# Patient Record
Sex: Male | Born: 1980 | Race: Black or African American | Hispanic: No | Marital: Single | Smoking: Current every day smoker
Health system: Southern US, Community
[De-identification: ages and names within clinical notes are randomized; demographics above are authoritative.]

## PROBLEM LIST (undated history)

## (undated) DIAGNOSIS — S43006A Unspecified dislocation of unspecified shoulder joint, initial encounter: Secondary | ICD-10-CM

## (undated) DIAGNOSIS — J45909 Unspecified asthma, uncomplicated: Secondary | ICD-10-CM

## (undated) DIAGNOSIS — Z8709 Personal history of other diseases of the respiratory system: Secondary | ICD-10-CM

## (undated) HISTORY — PX: ANKLE ARTHROSCOPY: SUR85

## (undated) HISTORY — PX: INGUINAL HERNIA REPAIR: SHX194

## (undated) HISTORY — PX: ROTATOR CUFF REPAIR: SHX139

---

## 2007-01-07 HISTORY — PX: HERNIA REPAIR: SHX51

## 2010-02-26 ENCOUNTER — Emergency Department (INDEPENDENT_AMBULATORY_CARE_PROVIDER_SITE_OTHER): Payer: Self-pay

## 2010-02-26 ENCOUNTER — Emergency Department (HOSPITAL_BASED_OUTPATIENT_CLINIC_OR_DEPARTMENT_OTHER)
Admission: EM | Admit: 2010-02-26 | Discharge: 2010-02-26 | Disposition: A | Discharge status: Home / Self Care | Payer: Self-pay | Attending: Emergency Medicine | Admitting: Emergency Medicine

## 2010-02-26 DIAGNOSIS — F17200 Nicotine dependence, unspecified, uncomplicated: Secondary | ICD-10-CM | POA: Insufficient documentation

## 2010-02-26 DIAGNOSIS — M25519 Pain in unspecified shoulder: Secondary | ICD-10-CM

## 2010-02-26 DIAGNOSIS — W19XXXA Unspecified fall, initial encounter: Secondary | ICD-10-CM

## 2010-02-26 DIAGNOSIS — IMO0002 Reserved for concepts with insufficient information to code with codable children: Secondary | ICD-10-CM | POA: Insufficient documentation

## 2010-02-26 DIAGNOSIS — X58XXXA Exposure to other specified factors, initial encounter: Secondary | ICD-10-CM | POA: Insufficient documentation

## 2010-05-29 ENCOUNTER — Emergency Department (INDEPENDENT_AMBULATORY_CARE_PROVIDER_SITE_OTHER): Payer: Self-pay

## 2010-05-29 ENCOUNTER — Emergency Department (HOSPITAL_BASED_OUTPATIENT_CLINIC_OR_DEPARTMENT_OTHER)
Admission: EM | Admit: 2010-05-29 | Discharge: 2010-05-29 | Disposition: A | Discharge status: Home / Self Care | Payer: Self-pay | Attending: Emergency Medicine | Admitting: Emergency Medicine

## 2010-05-29 DIAGNOSIS — X500XXA Overexertion from strenuous movement or load, initial encounter: Secondary | ICD-10-CM | POA: Insufficient documentation

## 2010-05-29 DIAGNOSIS — IMO0002 Reserved for concepts with insufficient information to code with codable children: Secondary | ICD-10-CM | POA: Insufficient documentation

## 2010-05-29 DIAGNOSIS — M25519 Pain in unspecified shoulder: Secondary | ICD-10-CM

## 2010-06-29 ENCOUNTER — Emergency Department (INDEPENDENT_AMBULATORY_CARE_PROVIDER_SITE_OTHER): Payer: Self-pay

## 2010-06-29 ENCOUNTER — Emergency Department (HOSPITAL_BASED_OUTPATIENT_CLINIC_OR_DEPARTMENT_OTHER)
Admission: EM | Admit: 2010-06-29 | Discharge: 2010-06-29 | Disposition: A | Discharge status: Home / Self Care | Payer: Self-pay | Attending: Emergency Medicine | Admitting: Emergency Medicine

## 2010-06-29 DIAGNOSIS — X58XXXA Exposure to other specified factors, initial encounter: Secondary | ICD-10-CM

## 2010-06-29 DIAGNOSIS — S62319A Displaced fracture of base of unspecified metacarpal bone, initial encounter for closed fracture: Secondary | ICD-10-CM

## 2010-06-29 DIAGNOSIS — M25549 Pain in joints of unspecified hand: Secondary | ICD-10-CM

## 2010-07-30 DIAGNOSIS — F17200 Nicotine dependence, unspecified, uncomplicated: Secondary | ICD-10-CM | POA: Insufficient documentation

## 2010-07-30 DIAGNOSIS — S60229A Contusion of unspecified hand, initial encounter: Secondary | ICD-10-CM | POA: Insufficient documentation

## 2010-07-30 DIAGNOSIS — X58XXXA Exposure to other specified factors, initial encounter: Secondary | ICD-10-CM | POA: Insufficient documentation

## 2010-07-31 ENCOUNTER — Emergency Department (HOSPITAL_BASED_OUTPATIENT_CLINIC_OR_DEPARTMENT_OTHER)
Admission: EM | Admit: 2010-07-31 | Discharge: 2010-07-31 | Disposition: A | Discharge status: Home / Self Care | Payer: Self-pay | Attending: Emergency Medicine | Admitting: Emergency Medicine

## 2010-07-31 ENCOUNTER — Emergency Department (INDEPENDENT_AMBULATORY_CARE_PROVIDER_SITE_OTHER): Payer: Self-pay

## 2010-07-31 ENCOUNTER — Encounter: Payer: Self-pay | Admitting: *Deleted

## 2010-07-31 DIAGNOSIS — T148XXA Other injury of unspecified body region, initial encounter: Secondary | ICD-10-CM

## 2010-07-31 DIAGNOSIS — M79609 Pain in unspecified limb: Secondary | ICD-10-CM

## 2010-07-31 DIAGNOSIS — IMO0001 Reserved for inherently not codable concepts without codable children: Secondary | ICD-10-CM

## 2010-07-31 MED ORDER — IBUPROFEN 800 MG PO TABS: 800.0000 mg | ORAL_TABLET | Freq: Three times a day (TID) | ORAL | Status: AC

## 2010-07-31 MED ORDER — IBUPROFEN 800 MG PO TABS
800.0000 mg | ORAL_TABLET | Freq: Once | ORAL | Status: AC
  Administered 2010-07-31: 800 mg via ORAL
  Filled 2010-07-31: qty 1

## 2010-07-31 NOTE — ED Notes (Signed)
Injured R hand two nights. C/o swelling/pain. Hx of recent hand fracture, and had his cast removed 1 week ago.

## 2010-07-31 NOTE — ED Provider Notes (Signed)
History     Chief Complaint  Patient presents with  . Hand Pain   Patient is a 30 y.o. male presenting with hand pain. The history is provided by the patient.  Hand Pain This is a recurrent problem. The current episode started 1 to 2 hours ago. The problem occurs constantly. The problem has not changed since onset.Pertinent negatives include no chest pain, no abdominal pain, no headaches and no shortness of breath. Exacerbated by: movement. The symptoms are relieved by nothing. He has tried acetaminophen for the symptoms. The treatment provided mild relief.   Recent hand Fx and recently had cast removed, did not require surgery for closed MC fx.  He was lifting his child tonight and bumped his hand with severe pain, no fall or injury otherwsie History reviewed. No pertinent past medical history.  Past Surgical History  Procedure Date  . Hand surgery   . Hernia repair     History reviewed. No pertinent family history.  History  Substance Use Topics  . Smoking status: Current Everyday Smoker  . Smokeless tobacco: Not on file  . Alcohol Use: Yes     rarely      Review of Systems  Constitutional: Negative for fever and chills.  HENT: Negative for neck pain and neck stiffness.   Eyes: Negative for pain.  Respiratory: Negative for shortness of breath.   Cardiovascular: Negative for chest pain.  Gastrointestinal: Negative for abdominal pain.  Genitourinary: Negative for dysuria.  Musculoskeletal: Negative for myalgias, back pain and joint swelling.  Skin: Negative for rash.  Neurological: Negative for headaches.  All other systems reviewed and are negative.    Physical Exam  BP 126/69  Pulse 91  Temp(Src) 98 F (36.7 C) (Oral)  Resp 18  Ht 5\' 6"  (1.676 m)  Wt 130 lb (58.968 kg)  BMI 20.98 kg/m2  Physical Exam  Constitutional: He is oriented to person, place, and time. He appears well-developed and well-nourished.  HENT:  Head: Normocephalic and atraumatic.  Eyes:  Conjunctivae and EOM are normal. Pupils are equal, round, and reactive to light.  Neck: Trachea normal. Neck supple. No thyromegaly present.  Cardiovascular: Normal rate, regular rhythm, S1 normal, S2 normal and normal pulses.     No systolic murmur is present   No diastolic murmur is present  Pulses:      Radial pulses are 2+ on the right side, and 2+ on the left side.  Pulmonary/Chest: Effort normal and breath sounds normal. He has no wheezes. He has no rhonchi. He has no rales. He exhibits no tenderness.  Abdominal: Soft. Normal appearance and bowel sounds are normal. There is no tenderness. There is no CVA tenderness and negative Murphy's sign.  Musculoskeletal:       RUE: TTP over dorsum of hand in area of 4th MC, no tenderness over snuff box, distal N/V intact, able to make a closed fist. No erythema or swelling  Neurological: He is alert and oriented to person, place, and time. He has normal strength. No cranial nerve deficit or sensory deficit. GCS eye subscore is 4. GCS verbal subscore is 5. GCS motor subscore is 6.  Skin: Skin is warm and dry. No rash noted. He is not diaphoretic.  Psychiatric: His speech is normal.       Cooperative and appropriate    ED Course  Procedures  MDM Xray obtained and reviewed has healing FX no acute abnormality. Placed in splint and RX NSAIDs plan f/u Ortho as needed  Sunnie Nielsen, MD 07/31/10 [id number]

## 2010-10-14 ENCOUNTER — Emergency Department (INDEPENDENT_AMBULATORY_CARE_PROVIDER_SITE_OTHER): Payer: Self-pay

## 2010-10-14 ENCOUNTER — Emergency Department (HOSPITAL_BASED_OUTPATIENT_CLINIC_OR_DEPARTMENT_OTHER)
Admission: EM | Admit: 2010-10-14 | Discharge: 2010-10-14 | Disposition: A | Discharge status: Home / Self Care | Payer: Self-pay | Attending: Emergency Medicine | Admitting: Emergency Medicine

## 2010-10-14 ENCOUNTER — Encounter (HOSPITAL_BASED_OUTPATIENT_CLINIC_OR_DEPARTMENT_OTHER): Payer: Self-pay | Admitting: *Deleted

## 2010-10-14 DIAGNOSIS — W010XXA Fall on same level from slipping, tripping and stumbling without subsequent striking against object, initial encounter: Secondary | ICD-10-CM | POA: Insufficient documentation

## 2010-10-14 DIAGNOSIS — M25519 Pain in unspecified shoulder: Secondary | ICD-10-CM | POA: Insufficient documentation

## 2010-10-14 DIAGNOSIS — Y92009 Unspecified place in unspecified non-institutional (private) residence as the place of occurrence of the external cause: Secondary | ICD-10-CM | POA: Insufficient documentation

## 2010-10-14 DIAGNOSIS — S60221A Contusion of right hand, initial encounter: Secondary | ICD-10-CM

## 2010-10-14 DIAGNOSIS — M79609 Pain in unspecified limb: Secondary | ICD-10-CM

## 2010-10-14 DIAGNOSIS — W19XXXA Unspecified fall, initial encounter: Secondary | ICD-10-CM

## 2010-10-14 DIAGNOSIS — M25512 Pain in left shoulder: Secondary | ICD-10-CM

## 2010-10-14 DIAGNOSIS — S60229A Contusion of unspecified hand, initial encounter: Secondary | ICD-10-CM | POA: Insufficient documentation

## 2010-10-14 MED ORDER — HYDROCODONE-ACETAMINOPHEN 5-325 MG PO TABS: 1.0000 | ORAL_TABLET | ORAL | Status: AC | PRN

## 2010-10-14 MED ORDER — OXYCODONE-ACETAMINOPHEN 5-325 MG PO TABS
1.0000 | ORAL_TABLET | Freq: Once | ORAL | Status: AC
  Administered 2010-10-14: 1 via ORAL
  Filled 2010-10-14: qty 1

## 2010-10-14 NOTE — ED Provider Notes (Signed)
History    Scribed for Lyanne Co, MD, the patient was seen in room MH11/MH11. This chart was scribed by Katha Cabal. This patient's care was started at 8:12 PM.    CSN: [id number] Arrival date & time: 10/14/2010  6:42 PM  Chief Complaint  Patient presents with  . Shoulder Injury  . Hand Pain    (Consider location/radiation/quality/duration/timing/severity/associated sxs/prior treatment) HPI Left shoulder injury last night.  Fell down injuried right hand.  Patient has dislocated left shoulder last night.  Patient tripped over toys in the hallway.    Chase Olson is a 30 y.o. male who presents to the Emergency Department complaining of left shoulder injury with associated right hand pain that occurred last night.  Patient tripped over toys in the hallway and landed on his right hand.  Patient has taken Ibuprofen for pain with minimal relief.   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     PAST MEDICAL HISTORY:  History reviewed. No pertinent past medical history.  PAST SURGICAL HISTORY:  Past Surgical History  Procedure Date  . Hand surgery   . Hernia repair     FAMILY HISTORY:  History reviewed. No pertinent family history.   SOCIAL HISTORY: History   Social History  . Marital Status: Married    Spouse Name: N/A    Number of Children: N/A  . Years of Education: N/A   Social History Main Topics  . Smoking status: Current Everyday Smoker  . Smokeless tobacco: None  . Alcohol Use: Yes     rarely  . Drug Use: Yes     Marijuana  . Sexually Active:    Other Topics Concern  . None   Social History Narrative  . None      Review of Systems  All other systems reviewed and are negative.    Allergies  Review of patient's allergies indicates no known allergies.  Home Medications   Current Outpatient Rx  Name Route Sig Dispense Refill  . HYDROCODONE-ACETAMINOPHEN 5-325 MG PO TABS Oral Take 1 tablet by  mouth every 4 (four) hours as needed for pain. 10 tablet 0    BP 113/70  Pulse 87  Temp(Src) 98.5 F (36.9 C) (Oral)  Resp 16  Ht 5\' 6"  (1.676 m)  Wt 135 lb (61.236 kg)  BMI 21.79 kg/m2  SpO2 100%  Physical Exam  Constitutional: He is oriented to person, place, and time. He appears well-developed and well-nourished.  HENT:  Head: Normocephalic.  Eyes: EOM are normal.  Neck: Normal range of motion.  Pulmonary/Chest: Effort normal.  Musculoskeletal: Normal range of motion.       Right hand: Tenderness at base of 3rd metacarpal and tender at distal aspect of 2nd metacarpal Nlm flexion and distant movement.  Nlm left clavicle, mild tenderness of left AC joint.  Patient is able to use left hand.    Neurological: He is alert and oriented to person, place, and time.  Psychiatric: He has a normal mood and affect.    ED Course  Procedures (including critical care time)  OTHER DATA REVIEWED: Nursing notes, vital signs, and past medical records reviewed.   DIAGNOSTIC STUDIES: Oxygen Saturation is 100% on room air, normal by my interpretation.     LABS / RADIOLOGY:   Dg Shoulder Left  10/14/2010  *RADIOLOGY REPORT*  Clinical Data: Fall, left shoulder pain, prior dislocation  LEFT SHOULDER - 2+ VIEW  Comparison: None.  Findings: No fracture  or dislocation is seen.  The joint spaces are preserved.  The visualized soft tissues are unremarkable.  Visualized left lung is clear.  IMPRESSION: No fracture or dislocation is seen.  Original Report Authenticated By: Charline Bills, M.D.   Dg Hand Complete Right  10/14/2010  *RADIOLOGY REPORT*  Clinical Data: Trauma/fall, right hand pain  RIGHT HAND - COMPLETE 3+ VIEW  Comparison: 07/31/2010  Findings: No acute fracture or dislocation.  Prior fracture at the base of the fourth metacarpal, healed.  The joint spaces are preserved.  The visualized soft tissues are unremarkable.  IMPRESSION: No evidence of acute fracture or dislocation.  Original  Report Authenticated By: Charline Bills, M.D.     ED COURSE / COORDINATION OF CARE: 8:17 PM  Physical exam complete.  Will order sling for left arm.   8:19 PM  Reviewed XRs.  Discussed findings with patient.    Orders Placed This Encounter  Procedures  . DG Shoulder Left  . DG Hand Complete Right  . Sling immobilizer          MDM   MDM: Patient with self relocated left shoulder dislocation.  Will be placed in a sling home with pain medicine.  Patient with right hand contusions negative x-rays.  Pain medicine will treat this as well  IMPRESSION: Diagnoses that have been ruled out:  Diagnoses that are still under consideration:  Final diagnoses:  Pain in left shoulder  Contusion of right hand     MEDICATIONS GIVEN IN THE E.D. Scheduled Meds:    . oxyCODONE-acetaminophen  1 tablet Oral Once   Continuous Infusions:     DISCHARGE MEDICATIONS: New Prescriptions   HYDROCODONE-ACETAMINOPHEN (NORCO) 5-325 MG PER TABLET    Take 1 tablet by mouth every 4 (four) hours as needed for pain.     I personally performed the services described in this documentation, which was scribed in my presence. The recorded information has been reviewed and considered.           Lyanne Co, MD 10/14/10 2045

## 2010-10-14 NOTE — ED Notes (Signed)
Pt states he dislocated his shoulder last pm  And injured his right hand by fall

## 2011-03-26 ENCOUNTER — Encounter (HOSPITAL_BASED_OUTPATIENT_CLINIC_OR_DEPARTMENT_OTHER): Payer: Self-pay | Admitting: Student

## 2011-03-26 ENCOUNTER — Emergency Department (HOSPITAL_BASED_OUTPATIENT_CLINIC_OR_DEPARTMENT_OTHER)
Admission: EM | Admit: 2011-03-26 | Discharge: 2011-03-26 | Disposition: A | Discharge status: Home / Self Care | Payer: Self-pay | Attending: Emergency Medicine | Admitting: Emergency Medicine

## 2011-03-26 ENCOUNTER — Emergency Department (INDEPENDENT_AMBULATORY_CARE_PROVIDER_SITE_OTHER): Payer: Self-pay

## 2011-03-26 DIAGNOSIS — M25519 Pain in unspecified shoulder: Secondary | ICD-10-CM

## 2011-03-26 DIAGNOSIS — S46919A Strain of unspecified muscle, fascia and tendon at shoulder and upper arm level, unspecified arm, initial encounter: Secondary | ICD-10-CM

## 2011-03-26 DIAGNOSIS — F17200 Nicotine dependence, unspecified, uncomplicated: Secondary | ICD-10-CM | POA: Insufficient documentation

## 2011-03-26 DIAGNOSIS — X58XXXA Exposure to other specified factors, initial encounter: Secondary | ICD-10-CM | POA: Insufficient documentation

## 2011-03-26 DIAGNOSIS — IMO0002 Reserved for concepts with insufficient information to code with codable children: Secondary | ICD-10-CM | POA: Insufficient documentation

## 2011-03-26 MED ORDER — HYDROCODONE-ACETAMINOPHEN 5-500 MG PO TABS: 1.0000 | ORAL_TABLET | Freq: Four times a day (QID) | ORAL | Status: AC | PRN

## 2011-03-26 MED ORDER — HYDROCODONE-ACETAMINOPHEN 5-325 MG PO TABS
1.0000 | ORAL_TABLET | Freq: Once | ORAL | Status: AC
  Administered 2011-03-26: 1 via ORAL
  Filled 2011-03-26: qty 1

## 2011-03-26 NOTE — ED Provider Notes (Signed)
History     CSN: [id number]  Arrival date & time 03/26/11  2001   First MD Initiated Contact with Patient 03/26/11 2043      Chief Complaint  Patient presents with  . Shoulder Pain    (Consider location/radiation/quality/duration/timing/severity/associated sxs/prior treatment) HPI Comments: Pt states that he has a history of being able to dislocate his shoulder and pop it back in:pt states that he normally is sore after but this time seems worse then normal:pt denies ever been seen by ortho for symptoms  Patient is a 31 y.o. male presenting with shoulder pain. The history is provided by the patient. No language interpreter was used.  Shoulder Pain This is a recurrent problem. The current episode started more than 1 year ago. The problem occurs intermittently. The problem has been resolved. The symptoms are aggravated by bending. He has tried nothing for the symptoms.    History reviewed. No pertinent past medical history.  Past Surgical History  Procedure Date  . Hand surgery   . Hernia repair     History reviewed. No pertinent family history.  History  Substance Use Topics  . Smoking status: Current Everyday Smoker  . Smokeless tobacco: Not on file  . Alcohol Use: Yes     rarely      Review of Systems  All other systems reviewed and are negative.    Allergies  Review of patient's allergies indicates no known allergies.  Home Medications   Current Outpatient Rx  Name Route Sig Dispense Refill  . IBUPROFEN 200 MG PO TABS Oral Take 200 mg by mouth every 6 (six) hours as needed. Patient used this medication for his body aches.      BP 108/69  Pulse 98  Temp(Src) 98.3 F (36.8 C) (Oral)  Resp 18  Ht 5\' 6"  (1.676 m)  Wt 135 lb (61.236 kg)  BMI 21.79 kg/m2  SpO2 100%  Physical Exam  Nursing note and vitals reviewed. Constitutional: He is oriented to person, place, and time. He appears well-developed and well-nourished.  HENT:  Head: Normocephalic and  atraumatic.  Cardiovascular: Normal rate and regular rhythm.   Pulmonary/Chest: Effort normal and breath sounds normal.  Musculoskeletal: Normal range of motion.       Left shoulder: He exhibits tenderness. He exhibits normal range of motion, no swelling and normal strength.  Neurological: He is alert and oriented to person, place, and time.  Skin: Skin is warm and dry.  Psychiatric: He has a normal mood and affect.    ED Course  Procedures (including critical care time)  Labs Reviewed - No data to display Dg Shoulder Left  03/26/2011  *RADIOLOGY REPORT*  Clinical Data: Recurrent shoulder dislocation  LEFT SHOULDER - 2+ VIEW  Comparison: 10/14/2010 and multiple previous  Findings: The humeral head is properly located.  The patient achieves good internal and external rotation.  No discernible Hill- Sachs or Bankart fracture.  Normal humeral acromial distance. Normal AC joint.  IMPRESSION: Normal radiographs.  No evidence of acute or previous dislocation.  Original Report Authenticated By: Thomasenia Sales, M.D.     1. Shoulder strain       MDM  Pt okay to follow up with ortho:will treat symptomatically for pain        Teressa Lower, NP 03/26/11 2131  Teressa Lower, NP 03/26/11 2132

## 2011-03-26 NOTE — ED Notes (Signed)
Pt in with c/o left shoulder pain s/p shoulder popping out last week and s/p after he was able to pop shoulder back in.

## 2011-03-26 NOTE — Discharge Instructions (Signed)
Ligament Sprain  Ligaments are tough, fibrous tissues that hold bones together at the joints. A sprain can occur when a ligament is stretched. This injury may take several weeks to heal.  HOME CARE INSTRUCTIONS    Rest the injured area for as long as directed by your caregiver. Then slowly start using the joint as directed by your caregiver and as the pain allows.   Keep the affected joint raised if possible to lessen swelling.   Apply ice for 15 to 20 minutes to the injured area every couple hours for the first half day, then 3 to 4 times per day for the first 48 hours. Put the ice in a plastic bag and place a towel between the bag of ice and your skin.   Wear any splinting, casting, or elastic bandage applications as instructed.   Only take over-the-counter or prescription medicines for pain, discomfort, or fever as directed by your caregiver. Do not use aspirin immediately after the injury unless instructed by your caregiver. Aspirin can cause increased bleeding and bruising of the tissues.   If you were given crutches, continue to use them as instructed and do not resume weight bearing on the affected extremity until instructed.  SEEK MEDICAL CARE IF:    Your bruising, swelling, or pain increases.   You have cold and numb fingers or toes if your arm or leg was injured.  SEEK IMMEDIATE MEDICAL CARE IF:    Your toes are numb or blue if your leg was injured.   Your fingers are numb or blue if your arm was injured.   Your pain is not responding to medicines and continues to stay the same or gets worse.  MAKE SURE YOU:    Understand these instructions.   Will watch your condition.   Will get help right away if you are not doing well or get worse.  Document Released: 12/21/1999 Document Revised: 12/12/2010 Document Reviewed: 10/19/2007  [employer] Patient Information 2012 [employer].

## 2011-03-27 NOTE — ED Provider Notes (Signed)
Medical screening examination/treatment/procedure(s) were performed by non-physician practitioner and as supervising physician I was immediately available for consultation/collaboration.   Gwyneth Sprout, MD 03/27/11 1535

## 2011-04-09 NOTE — ED Notes (Signed)
Pt called requesting follow-up information and sts he is unable to find his d/c paperwork. Pt given information.

## 2011-04-18 ENCOUNTER — Encounter (HOSPITAL_COMMUNITY): Payer: Self-pay | Admitting: Emergency Medicine

## 2011-04-18 ENCOUNTER — Emergency Department (HOSPITAL_COMMUNITY)
Admission: EM | Admit: 2011-04-18 | Discharge: 2011-04-18 | Disposition: A | Discharge status: Home / Self Care | Payer: Self-pay | Attending: Emergency Medicine | Admitting: Emergency Medicine

## 2011-04-18 DIAGNOSIS — R197 Diarrhea, unspecified: Secondary | ICD-10-CM | POA: Insufficient documentation

## 2011-04-18 DIAGNOSIS — R112 Nausea with vomiting, unspecified: Secondary | ICD-10-CM | POA: Insufficient documentation

## 2011-04-18 DIAGNOSIS — R509 Fever, unspecified: Secondary | ICD-10-CM | POA: Insufficient documentation

## 2011-04-18 MED ORDER — SODIUM CHLORIDE 0.9 % IV BOLUS (SEPSIS)
1000.0000 mL | Freq: Once | INTRAVENOUS | Status: AC
  Administered 2011-04-18: 1000 mL via INTRAVENOUS

## 2011-04-18 MED ORDER — PANTOPRAZOLE SODIUM 40 MG IV SOLR
40.0000 mg | Freq: Once | INTRAVENOUS | Status: AC
  Administered 2011-04-18: 40 mg via INTRAVENOUS
  Filled 2011-04-18: qty 40

## 2011-04-18 MED ORDER — ONDANSETRON HCL 8 MG PO TABS: 8.0000 mg | ORAL_TABLET | Freq: Three times a day (TID) | ORAL | Status: AC | PRN

## 2011-04-18 MED ORDER — ONDANSETRON HCL 4 MG/2ML IJ SOLN
4.0000 mg | Freq: Once | INTRAMUSCULAR | Status: AC
  Administered 2011-04-18: 4 mg via INTRAVENOUS
  Filled 2011-04-18: qty 2

## 2011-04-18 MED ORDER — MORPHINE SULFATE 4 MG/ML IJ SOLN
4.0000 mg | Freq: Once | INTRAMUSCULAR | Status: AC
  Administered 2011-04-18: 4 mg via INTRAVENOUS
  Filled 2011-04-18: qty 1

## 2011-04-18 NOTE — Discharge Instructions (Signed)
Rest. Drink plenty of fluids.  Follow up with primary care doctor in the next couple days if symptoms fail to improve/resolve. Return to ER if worse, severe abdominal pain, persistent vomiting, other concern.   You were given pain medication in the ER - no driving for the next 6 hours.        Nausea and Vomiting Nausea is a sick feeling that often comes before throwing up (vomiting). Vomiting is a reflex where stomach contents come out of your mouth. Vomiting can cause severe loss of body fluids (dehydration). Children and elderly adults can become dehydrated quickly, especially if they also have diarrhea. Nausea and vomiting are symptoms of a condition or disease. It is important to find the cause of your symptoms. CAUSES   Direct irritation of the stomach lining. This irritation can result from increased acid production (gastroesophageal reflux disease), infection, food poisoning, taking certain medicines (such as nonsteroidal anti-inflammatory drugs), alcohol use, or tobacco use.   Signals from the brain.These signals could be caused by a headache, heat exposure, an inner ear disturbance, increased pressure in the brain from injury, infection, a tumor, or a concussion, pain, emotional stimulus, or metabolic problems.   An obstruction in the gastrointestinal tract (bowel obstruction).   Illnesses such as diabetes, hepatitis, gallbladder problems, appendicitis, kidney problems, cancer, sepsis, atypical symptoms of a heart attack, or eating disorders.   Medical treatments such as chemotherapy and radiation.   Receiving medicine that makes you sleep (general anesthetic) during surgery.  DIAGNOSIS Your caregiver may ask for tests to be done if the problems do not improve after a few days. Tests may also be done if symptoms are severe or if the reason for the nausea and vomiting is not clear. Tests may include:  Urine tests.   Blood tests.   Stool tests.   Cultures (to look for  evidence of infection).   X-rays or other imaging studies.  Test results can help your caregiver make decisions about treatment or the need for additional tests. TREATMENT You need to stay well hydrated. Drink frequently but in small amounts.You may wish to drink water, sports drinks, clear broth, or eat frozen ice pops or gelatin dessert to help stay hydrated.When you eat, eating slowly may help prevent nausea.There are also some antinausea medicines that may help prevent nausea. HOME CARE INSTRUCTIONS   Take all medicine as directed by your caregiver.   If you do not have an appetite, do not force yourself to eat. However, you must continue to drink fluids.   If you have an appetite, eat a normal diet unless your caregiver tells you differently.   Eat a variety of complex carbohydrates (rice, wheat, potatoes, bread), lean meats, yogurt, fruits, and vegetables.   Avoid high-fat foods because they are more difficult to digest.   Drink enough water and fluids to keep your urine clear or pale yellow.   If you are dehydrated, ask your caregiver for specific rehydration instructions. Signs of dehydration may include:   Severe thirst.   Dry lips and mouth.   Dizziness.   Dark urine.   Decreasing urine frequency and amount.   Confusion.   Rapid breathing or pulse.  SEEK IMMEDIATE MEDICAL CARE IF:   You have blood or brown flecks (like coffee grounds) in your vomit.   You have black or bloody stools.   You have a severe headache or stiff neck.   You are confused.   You have severe abdominal pain.   You  have chest pain or trouble breathing.   You do not urinate at least once every 8 hours.   You develop cold or clammy skin.   You continue to vomit for longer than 24 to 48 hours.   You have a fever.  MAKE SURE YOU:   Understand these instructions.   Will watch your condition.   Will get help right away if you are not doing well or get worse.  Document  Released: 12/23/2004 Document Revised: 12/12/2010 Document Reviewed: 05/22/2010 [facility] Patient Information 2012 [place], [place].     Diarrhea Infections caused by germs (bacterial) or a virus commonly cause diarrhea. Your caregiver has determined that with time, rest and fluids, the diarrhea should improve. In general, eat normally while drinking more water than usual. Although water may prevent dehydration, it does not contain salt and minerals (electrolytes). Broths, weak tea without caffeine and oral rehydration solutions (ORS) replace fluids and electrolytes. Small amounts of fluids should be taken frequently. Large amounts at one time may not be tolerated. Plain water may be harmful in infants and the elderly. Oral rehydrating solutions (ORS) are available at pharmacies and grocery stores. ORS replace water and important electrolytes in proper proportions. Sports drinks are not as effective as ORS and may be harmful due to sugars worsening diarrhea.  ORS is especially recommended for use in children with diarrhea. As a general guideline for children, replace any new fluid losses from diarrhea and/or vomiting with ORS as follows:   If your child weighs 22 pounds or under (10 kg or less), give 60-120 mL ( -  cup or 2 - 4 ounces) of ORS for each episode of diarrheal stool or vomiting episode.   If your child weighs more than 22 pounds (more than 10 kgs), give 120-240 mL ( - 1 cup or 4 - 8 ounces) of ORS for each diarrheal stool or episode of vomiting.   While correcting for dehydration, children should eat normally. However, foods high in sugar should be avoided because this may worsen diarrhea. Large amounts of carbonated soft drinks, juice, gelatin desserts and other highly sugared drinks should be avoided.   After correction of dehydration, other liquids that are appealing to the child may be added. Children should drink small amounts of fluids frequently and fluids should be increased as  tolerated. Children should drink enough fluids to keep urine clear or pale yellow.   Adults should eat normally while drinking more fluids than usual. Drink small amounts of fluids frequently and increase as tolerated. Drink enough fluids to keep urine clear or pale yellow. Broths, weak decaffeinated tea, lemon lime soft drinks (allowed to go flat) and ORS replace fluids and electrolytes.   Avoid:   Carbonated drinks.   Juice.   Extremely hot or cold fluids.   Caffeine drinks.   Fatty, greasy foods.   Alcohol.   Tobacco.   Too much intake of anything at one time.   Gelatin desserts.   Probiotics are active cultures of beneficial bacteria. They may lessen the amount and number of diarrheal stools in adults. Probiotics can be found in yogurt with active cultures and in supplements.   Wash hands well to avoid spreading bacteria and virus.   Anti-diarrheal medications are not recommended for infants and children.   Only take over-the-counter or prescription medicines for pain, discomfort or fever as directed by your caregiver. Do not give aspirin to children because it may cause Reye's Syndrome.   For adults, ask your caregiver  if you should continue all prescribed and over-the-counter medicines.   If your caregiver has given you a follow-up appointment, it is very important to keep that appointment. Not keeping the appointment could result in a chronic or permanent injury, and disability. If there is any problem keeping the appointment, you must call back to this facility for assistance.  SEEK IMMEDIATE MEDICAL CARE IF:   You or your child is unable to keep fluids down or other symptoms or problems become worse in spite of treatment.   Vomiting or diarrhea develops and becomes persistent.   There is vomiting of blood or bile (green material).   There is blood in the stool or the stools are black and tarry.   There is no urine output in 6-8 hours or there is only a small  amount of very dark urine.   Abdominal pain develops, increases or localizes.   You have a fever.   Your baby is older than 3 months with a rectal temperature of 102 F (38.9 C) or higher.   Your baby is 32 months old or younger with a rectal temperature of 100.4 F (38 C) or higher.   You or your child develops excessive weakness, dizziness, fainting or extreme thirst.   You or your child develops a rash, stiff neck, severe headache or become irritable or sleepy and difficult to awaken.  MAKE SURE YOU:   Understand these instructions.   Will watch your condition.   Will get help right away if you are not doing well or get worse.  Document Released: 12/13/2001 Document Revised: 12/12/2010 Document Reviewed: 10/30/2008 [facility] Patient Information 2012 [place], [place].

## 2011-04-18 NOTE — ED Notes (Signed)
N/v x 2 days w/fever.  Diarrhea on day 1 but no more. Has taken girlfriends promethazine w/o relief.

## 2011-04-18 NOTE — ED Provider Notes (Signed)
History     CSN: [id number]  Arrival date & time 04/18/11  1736   First MD Initiated Contact with Patient 04/18/11 2048      Chief Complaint  Patient presents with  . Nausea  . Emesis  . Fever    (Consider location/radiation/quality/duration/timing/severity/associated sxs/prior treatment) Patient is a 31 y.o. male presenting with vomiting and fever. The history is provided by the patient.  Emesis  Associated symptoms include diarrhea and a fever. Pertinent negatives include no cough and no headaches.  Fever Primary symptoms of the febrile illness include fever, vomiting and diarrhea. Primary symptoms do not include headaches, cough, shortness of breath or rash.  pt with nvd since yesterday morning. Several episodes of each. Vomiting yellowish. Diarrhea loose to watery. No bloody vomit or diarrhea. Subjective fevers. Intermittent mid abd cramping, no constant/focal abd pain. No back or flank pain. No gu c/o x decreased frequency. States possibly ate some bad food prior to symptom onset. No known ill contacts. No recent new med or abx use. No cough or uri c/o. No prior abd surgery. Diarrhea has improved. Nausea persists.   History reviewed. No pertinent past medical history.  Past Surgical History  Procedure Date  . Hand surgery   . Hernia repair     No family history on file.  History  Substance Use Topics  . Smoking status: Current Everyday Smoker  . Smokeless tobacco: Not on file  . Alcohol Use: No     rarely      Review of Systems  Constitutional: Positive for fever.  HENT: Negative for sore throat and neck pain.   Eyes: Negative for redness.  Respiratory: Negative for cough and shortness of breath.   Cardiovascular: Negative for chest pain.  Gastrointestinal: Positive for vomiting and diarrhea.  Genitourinary: Negative for flank pain.  Musculoskeletal: Negative for back pain.  Skin: Negative for rash.  Neurological: Negative for light-headedness and headaches.   Hematological: Does not bruise/bleed easily.  Psychiatric/Behavioral: Negative for confusion.    Allergies  Review of patient's allergies indicates no known allergies.  Home Medications   Current Outpatient Rx  Name Route Sig Dispense Refill  . ACETAMINOPHEN 500 MG PO TABS Oral Take 500 mg by mouth every 6 (six) hours as needed. For pain relief    . IBUPROFEN 200 MG PO TABS Oral Take 200 mg by mouth every 6 (six) hours as needed. Patient used this medication for his body aches.    Marland Kitchen PROMETHAZINE HCL 25 MG PO TABS Oral Take 25 mg by mouth once.      BP 155/101  Pulse 71  Temp(Src) 99.5 F (37.5 C) (Oral)  Resp 16  SpO2 99%  Physical Exam  Nursing note and vitals reviewed. Constitutional: He is oriented to person, place, and time. He appears well-developed and well-nourished. No distress.  HENT:  Head: Atraumatic.  Nose: Nose normal.  Mouth/Throat: Oropharynx is clear and moist.  Eyes: Pupils are equal, round, and reactive to light.  Neck: Neck supple. No tracheal deviation present.       No stiffness or rigidity  Cardiovascular: Normal rate, regular rhythm, normal heart sounds and intact distal pulses.   Pulmonary/Chest: Effort normal and breath sounds normal. No accessory muscle usage. No respiratory distress.  Abdominal: Soft. Bowel sounds are normal. He exhibits no distension and no mass. There is no tenderness. There is no rebound and no guarding.  Genitourinary:       No cva tenderness  Musculoskeletal: Normal range of motion.  He exhibits no edema and no tenderness.  Neurological: He is alert and oriented to person, place, and time.  Skin: Skin is warm and dry.  Psychiatric: He has a normal mood and affect.    ED Course  Procedures (including critical care time)     MDM  Iv ns bolus. zofran iv. protonix iv. Morphine iv.   2nd liter ns. Feels much improved on recheck. abd soft nt. No nvd in ed.       Suzi Roots, MD 04/18/11 [phone]

## 2011-04-18 NOTE — ED Notes (Signed)
Pt presenting to ed with c/o abdominal pain with nausea, vomiting and diarrhea onset yesterday

## 2012-02-24 ENCOUNTER — Emergency Department (HOSPITAL_BASED_OUTPATIENT_CLINIC_OR_DEPARTMENT_OTHER)
Admission: EM | Admit: 2012-02-24 | Discharge: 2012-02-24 | Disposition: A | Discharge status: Home / Self Care | Payer: Self-pay | Attending: Emergency Medicine | Admitting: Emergency Medicine

## 2012-02-24 ENCOUNTER — Encounter (HOSPITAL_BASED_OUTPATIENT_CLINIC_OR_DEPARTMENT_OTHER): Payer: Self-pay | Admitting: Emergency Medicine

## 2012-02-24 DIAGNOSIS — IMO0002 Reserved for concepts with insufficient information to code with codable children: Secondary | ICD-10-CM | POA: Insufficient documentation

## 2012-02-24 DIAGNOSIS — L02411 Cutaneous abscess of right axilla: Secondary | ICD-10-CM

## 2012-02-24 DIAGNOSIS — F17200 Nicotine dependence, unspecified, uncomplicated: Secondary | ICD-10-CM | POA: Insufficient documentation

## 2012-02-24 MED ORDER — LIDOCAINE-EPINEPHRINE 2 %-1:100000 IJ SOLN
INTRAMUSCULAR | Status: AC
  Administered 2012-02-24: 1 mL
  Filled 2012-02-24: qty 1

## 2012-02-24 MED ORDER — HYDROCODONE-ACETAMINOPHEN 5-325 MG PO TABS
1.0000 | ORAL_TABLET | Freq: Once | ORAL | Status: AC
  Administered 2012-02-24: 1 via ORAL
  Filled 2012-02-24: qty 1

## 2012-02-24 NOTE — ED Provider Notes (Signed)
History     CSN: [id number]  Arrival date & time 02/24/12  0036   First MD Initiated Contact with Patient 02/24/12 0047      Chief Complaint  Patient presents with  . Abscess    (Consider location/radiation/quality/duration/timing/severity/associated sxs/prior treatment) HPI This is a 32 year old male with a 2 to three-day history of a tender, swollen lump in his right axilla. It has become exquisitely tender over the past 24 hours. Pain is worse with movement or palpation. There's been no drainage. She denies any systemic symptoms such as fever, chills, nausea, vomiting or diarrhea. He does not have a history of abscesses in the past.  History reviewed. No pertinent past medical history.  Past Surgical History  Procedure Laterality Date  . Hand surgery    . Hernia repair      History reviewed. No pertinent family history.  History  Substance Use Topics  . Smoking status: Current Every Day Smoker -- 1.00 packs/day    Types: Cigarettes  . Smokeless tobacco: Not on file  . Alcohol Use: No     Comment: rarely      Review of Systems  All other systems reviewed and are negative.    Allergies  Review of patient's allergies indicates no known allergies.  Home Medications   Current Outpatient Rx  Name  Route  Sig  Dispense  Refill  . acetaminophen (TYLENOL) 500 MG tablet   Oral   Take 500 mg by mouth every 6 (six) hours as needed. For pain relief         . ibuprofen (ADVIL,MOTRIN) 200 MG tablet   Oral   Take 200 mg by mouth every 6 (six) hours as needed. Patient used this medication for his body aches.         . promethazine (PHENERGAN) 25 MG tablet   Oral   Take 25 mg by mouth once.           BP 115/77  Pulse 72  Temp(Src) 97.9 F (36.6 C) (Oral)  Resp 16  Ht 5\' 6"  (1.676 m)  Wt 132 lb (59.875 kg)  BMI 21.32 kg/m2  SpO2 100%  Physical Exam General: Well-developed, well-nourished male in no acute distress; appearance consistent with age of  record HENT: normocephalic, atraumatic Eyes: pupils equal round and reactive to light; extraocular muscles intact Neck: supple Heart: regular rate and rhythm Lungs: Normal respiratory effort and excursion Abdomen: soft; nondistended; nontender Extremities: No deformity; full range of motion Neurologic: Awake, alert and oriented; motor function intact in all extremities and symmetric; no facial droop Skin: Warm and dry; abscess right axilla approximately 1 x 1.5 cm Psychiatric: Normal mood and affect    ED Course  Procedures (including critical care time)  INCISION AND DRAINAGE Performed by: Paula Libra L Consent: Verbal consent obtained. Risks and benefits: risks, benefits and alternatives were discussed Type: abscess  Body area: Right axilla  Anesthesia: local infiltration  Incision was made with a scalpel.  Local anesthetic: lidocaine 2 % with epinephrine  Anesthetic total: 1 ml  Complexity: complex Blunt dissection to break up loculations  Drainage: purulent  Drainage amount: Scant   Packing material: 1/4 in iodoform gauze  Patient tolerance: Patient tolerated the procedure well with no immediate complications.     MDM  Patient was advised to remove the packing in 1-2 days. The packing was placed to avoid the wound resealing, as the axilla tends to be held in a position apposing the wound edges.  Carlisle Beers Lyda Colcord, MD 02/24/12 0100

## 2012-02-24 NOTE — ED Notes (Signed)
Pt has had bump under arm for several days, this am awoke  And area had enlarged

## 2012-02-27 ENCOUNTER — Emergency Department (HOSPITAL_BASED_OUTPATIENT_CLINIC_OR_DEPARTMENT_OTHER): Payer: Self-pay

## 2012-02-27 ENCOUNTER — Emergency Department (HOSPITAL_BASED_OUTPATIENT_CLINIC_OR_DEPARTMENT_OTHER)
Admission: EM | Admit: 2012-02-27 | Discharge: 2012-02-27 | Disposition: A | Discharge status: Home / Self Care | Payer: Self-pay | Attending: Emergency Medicine | Admitting: Emergency Medicine

## 2012-02-27 ENCOUNTER — Encounter (HOSPITAL_BASED_OUTPATIENT_CLINIC_OR_DEPARTMENT_OTHER): Payer: Self-pay

## 2012-02-27 DIAGNOSIS — R296 Repeated falls: Secondary | ICD-10-CM | POA: Insufficient documentation

## 2012-02-27 DIAGNOSIS — Z87828 Personal history of other (healed) physical injury and trauma: Secondary | ICD-10-CM | POA: Insufficient documentation

## 2012-02-27 DIAGNOSIS — F17200 Nicotine dependence, unspecified, uncomplicated: Secondary | ICD-10-CM | POA: Insufficient documentation

## 2012-02-27 DIAGNOSIS — Y929 Unspecified place or not applicable: Secondary | ICD-10-CM | POA: Insufficient documentation

## 2012-02-27 DIAGNOSIS — S43006A Unspecified dislocation of unspecified shoulder joint, initial encounter: Secondary | ICD-10-CM | POA: Insufficient documentation

## 2012-02-27 DIAGNOSIS — Y9389 Activity, other specified: Secondary | ICD-10-CM | POA: Insufficient documentation

## 2012-02-27 HISTORY — DX: Unspecified dislocation of unspecified shoulder joint, initial encounter: S43.006A

## 2012-02-27 NOTE — ED Provider Notes (Signed)
History     CSN: [id number]  Arrival date & time 02/27/12  1533   First MD Initiated Contact with Patient 02/27/12 1534      Chief Complaint  Patient presents with  . Dislocation  . Shoulder Injury    (Consider location/radiation/quality/duration/timing/severity/associated sxs/prior treatment) HPI  Patient with left shoulder dislocation.  Patient fell and caught self with left hand when falling.  Transported via ems with fentanyl given en route.  Patient states he has had multiple dislocations in past and is usually able to relocate by self.  Denies any other injury, no head trauma, no numbness, tingling or pain.     Past Medical History  Diagnosis Date  . Shoulder dislocation     Past Surgical History  Procedure Laterality Date  . Hand surgery    . Hernia repair      No family history on file.  History  Substance Use Topics  . Smoking status: Current Every Day Smoker -- 1.00 packs/day    Types: Cigarettes  . Smokeless tobacco: Not on file  . Alcohol Use: No     Comment: rarely      Review of Systems  All other systems reviewed and are negative.    Allergies  Percocet; Tylenol; and Vicodin  Home Medications  No current outpatient prescriptions on file.  BP 138/83  Pulse 78  Temp(Src) 98.1 F (36.7 C) (Oral)  Resp 20  SpO2 100%  Physical Exam  Vitals reviewed. Constitutional: He is oriented to person, place, and time. He appears well-developed and well-nourished.  HENT:  Head: Normocephalic and atraumatic.  Eyes: Conjunctivae are normal. Pupils are equal, round, and reactive to light.  Neck: Neck supple.  Cardiovascular: Normal rate and regular rhythm.   Pulmonary/Chest: Effort normal and breath sounds normal.  Abdominal: Soft. Bowel sounds are normal.  Musculoskeletal:  Left shoulder with deformity  Pulses intact sensation intact  Neurological: He is alert and oriented to person, place, and time.  Skin: Skin is warm and dry.  Psychiatric:  He has a normal mood and affect.    ED Course  ORTHOPEDIC INJURY TREATMENT Date/Time: 02/27/2012 3:53 PM Performed by: Hilario Quarry Authorized by: Hilario Quarry Consent: The procedure was performed in an emergent situation. Risks and benefits: risks, benefits and alternatives were discussed Injury location: shoulder Pre-procedure neurovascular assessment: neurovascularly intact Pre-procedure distal perfusion: normal Pre-procedure neurological function: normal Pre-procedure range of motion: reduced Local anesthesia used: no Patient sedated: no Immobilization: sling Post-procedure neurovascular assessment: post-procedure neurovascularly intact Post-procedure distal perfusion: normal Post-procedure neurological function: normal Post-procedure range of motion: improved Patient tolerance: Patient tolerated the procedure well with no immediate complications.   (including critical care time)  Labs Reviewed - No data to display No results found.   No diagnosis found.    Post reduction film with good replacement.         Hilario Quarry, MD 02/27/12 [phone]

## 2012-02-27 NOTE — ED Notes (Signed)
Closed reduction left shoulder performed by Dr. Rosalia Hammers upon arrival to ED room 1.  Pt tolerated well.  Left shoulder appears to be aligned, however awaiting XR for confirmation.  PMS intact and pt reports a decrease in pain.

## 2012-02-27 NOTE — ED Notes (Signed)
Dislocation to left shoulder that occurred after a fall.

## 2012-08-20 ENCOUNTER — Emergency Department (HOSPITAL_BASED_OUTPATIENT_CLINIC_OR_DEPARTMENT_OTHER)
Admission: EM | Admit: 2012-08-20 | Discharge: 2012-08-20 | Disposition: A | Discharge status: Home / Self Care | Payer: Self-pay | Attending: Emergency Medicine | Admitting: Emergency Medicine

## 2012-08-20 ENCOUNTER — Encounter (HOSPITAL_BASED_OUTPATIENT_CLINIC_OR_DEPARTMENT_OTHER): Payer: Self-pay | Admitting: Student

## 2012-08-20 ENCOUNTER — Emergency Department (HOSPITAL_BASED_OUTPATIENT_CLINIC_OR_DEPARTMENT_OTHER): Payer: Self-pay

## 2012-08-20 DIAGNOSIS — Z87828 Personal history of other (healed) physical injury and trauma: Secondary | ICD-10-CM | POA: Insufficient documentation

## 2012-08-20 DIAGNOSIS — M25512 Pain in left shoulder: Secondary | ICD-10-CM

## 2012-08-20 DIAGNOSIS — R52 Pain, unspecified: Secondary | ICD-10-CM | POA: Insufficient documentation

## 2012-08-20 DIAGNOSIS — M25519 Pain in unspecified shoulder: Secondary | ICD-10-CM | POA: Insufficient documentation

## 2012-08-20 DIAGNOSIS — F17200 Nicotine dependence, unspecified, uncomplicated: Secondary | ICD-10-CM | POA: Insufficient documentation

## 2012-08-20 MED ORDER — TRAMADOL HCL 50 MG PO TABS: 50.0000 mg | ORAL_TABLET | Freq: Four times a day (QID) | ORAL | Status: DC | PRN

## 2012-08-20 MED ORDER — KETOROLAC TROMETHAMINE 30 MG/ML IJ SOLN
30.0000 mg | Freq: Once | INTRAMUSCULAR | Status: AC
  Administered 2012-08-20: 30 mg via INTRAMUSCULAR
  Filled 2012-08-20: qty 1

## 2012-08-20 NOTE — ED Notes (Signed)
Pt called and sts he called Toni Arthurs, MD for ortho f/u and was told he could not be seen there. This nurse called Dr. Victorino Dike office and was told that pt has to be referred to [facility] since he is self-pay. Pt called and given info.

## 2012-08-20 NOTE — ED Notes (Signed)
Left shoulder dislocation 3 days ago and reports muscle cramping and spasms. No obvious dislocation or tenderness to palpation.

## 2012-08-20 NOTE — ED Provider Notes (Signed)
Medical screening examination/treatment/procedure(s) were performed by non-physician practitioner and as supervising physician I was immediately available for consultation/collaboration.   Leamon Palau H Nilaya Bouie, MD 08/20/12 1531 

## 2012-08-20 NOTE — ED Provider Notes (Signed)
CSN: [id number]     Arrival date & time 08/20/12  1217 History     First MD Initiated Contact with Patient 08/20/12 1224     Chief Complaint  Patient presents with  . Shoulder Pain    3 days ago - left shoulder   (Consider location/radiation/quality/duration/timing/severity/associated sxs/prior Treatment) HPI Comments: Patient is a 32 year old male with a history of left shoulder dislocation who presents for left shoulder pain onset yesterday. Patient states that he was hopping over a fence to get a ball for his children when his left shoulder dislocated. Patient states he was able to subsequently pop the shoulder back in to proper alignment. Patient endorses his shoulder dislocating intermittently last night, approximately 3 times. Patient was able to reduce his shoulder manually each time, but states that his shoulder has been increasingly sore. Patient states that the soreness is worse with movement and improved with mobilization. He has tried Aleve with only mild relief of symptoms. Patient endorses being followed by an orthopedist for history of left shoulder dislocation, but states that he has not followed up in at least a year. Patient denies erythema, tongue swelling, numbness or tingling, and extremity weakness.  Patient is a 32 y.o. male presenting with shoulder pain. The history is provided by the patient. No language interpreter was used.  Shoulder Pain This is a recurrent problem. The current episode started yesterday. The problem occurs intermittently. The problem has been unchanged. Associated symptoms include arthralgias. Pertinent negatives include no fever, joint swelling, numbness or weakness. Exacerbated by: shoulder movement. Treatments tried: Aleve. The treatment provided mild relief.    Past Medical History  Diagnosis Date  . Shoulder dislocation    Past Surgical History  Procedure Laterality Date  . Hand surgery    . Hernia repair     No family history on  file. History  Substance Use Topics  . Smoking status: Current Every Day Smoker -- 1.00 packs/day    Types: Cigarettes  . Smokeless tobacco: Not on file  . Alcohol Use: No     Comment: rarely    Review of Systems  Constitutional: Negative for fever.  Musculoskeletal: Positive for arthralgias. Negative for joint swelling.  Skin: Negative for color change and pallor.  Neurological: Negative for weakness and numbness.  All other systems reviewed and are negative.    Allergies  Percocet; Tylenol; and Vicodin  Home Medications   Current Outpatient Rx  Name  Route  Sig  Dispense  Refill  . traMADol (ULTRAM) 50 MG tablet   Oral   Take 1 tablet (50 mg total) by mouth every 6 (six) hours as needed for pain.   15 tablet   0    BP 111/77  Pulse 93  Temp(Src) 98.2 F (36.8 C) (Oral)  Resp 20  Wt 130 lb (58.968 kg)  BMI 20.99 kg/m2  Physical Exam  Nursing note and vitals reviewed. Constitutional: He is oriented to person, place, and time. He appears well-developed and well-nourished.  HENT:  Head: Normocephalic and atraumatic.  Eyes: Conjunctivae and EOM are normal. No scleral icterus.  Neck: Normal range of motion.  Cardiovascular: Normal rate, regular rhythm and intact distal pulses.   Distal radial pulses 2+ bilaterally. Capillary refill normal.  Pulmonary/Chest: Effort normal. No respiratory distress.  Musculoskeletal:       Left shoulder: He exhibits decreased range of motion (secondary to discomfort ) and pain. He exhibits no tenderness, no bony tenderness, no swelling, no effusion, no crepitus, no deformity, normal  pulse and normal strength.       Cervical back: Normal.       Left upper arm: Normal.  Neurological: He is alert and oriented to person, place, and time.  No sensory or motor deficits appreciated. DTRs normal and symmetric.  Skin: Skin is warm and dry. No rash noted. He is not diaphoretic. No erythema. No pallor.  Psychiatric: He has a normal mood and  affect. His behavior is normal.   ED Course   Procedures (including critical care time)  Labs Reviewed - No data to display Dg Shoulder Left  08/20/2012   *RADIOLOGY REPORT*  Clinical Data: Left shoulder pain  LEFT SHOULDER - 2+ VIEW  Comparison: 02/27/2012  Findings: There is a small bony fragment noted along the inferior aspect of the labrum.  This was not present on the prior exam.  No other focal abnormality is noted.  IMPRESSION: Small bony density adjacent to the labrum as described above.  This may be related to recent trauma. Correlation with the patient's history is recommended.   Original Report Authenticated By: Alcide Clever, M.D.   1. Shoulder pain, acute, left    MDM  32 year old male with a history of left shoulder dislocation who presents for shoulder pain after multiple dislocations yesterday. Patient neurovascularly intact on arrival with no bony deformities or bony tenderness. He exhibits full range of motion of his left shoulder. Chest x-ray significant for bony density adjacent to the labrum; findings likely secondary to frequent dislocations. Patient given Toradol and ED for pain control with moderate relief. Shoulder immobilizer applied. Patient appropriate for discharge with orthopedic followup. Prescription for Ultram given for pain control. Indications for ED return discussed and patient agreeable to plan.     Antony Madura, PA-C 08/20/12 1422

## 2012-09-15 ENCOUNTER — Other Ambulatory Visit (HOSPITAL_COMMUNITY): Payer: Self-pay | Admitting: Orthopedic Surgery

## 2012-09-15 DIAGNOSIS — M25512 Pain in left shoulder: Secondary | ICD-10-CM

## 2012-09-16 ENCOUNTER — Other Ambulatory Visit (HOSPITAL_COMMUNITY): Payer: Self-pay | Admitting: Orthopedic Surgery

## 2012-09-16 DIAGNOSIS — R52 Pain, unspecified: Secondary | ICD-10-CM

## 2012-09-16 DIAGNOSIS — M25372 Other instability, left ankle: Secondary | ICD-10-CM

## 2012-09-17 ENCOUNTER — Ambulatory Visit (HOSPITAL_COMMUNITY)
Admission: RE | Admit: 2012-09-17 | Discharge: 2012-09-17 | Disposition: A | Discharge status: Home / Self Care | Payer: Self-pay | Source: Ambulatory Visit | Attending: Orthopedic Surgery | Admitting: Orthopedic Surgery

## 2012-09-17 DIAGNOSIS — X58XXXA Exposure to other specified factors, initial encounter: Secondary | ICD-10-CM | POA: Insufficient documentation

## 2012-09-17 DIAGNOSIS — M25372 Other instability, left ankle: Secondary | ICD-10-CM

## 2012-09-17 DIAGNOSIS — Z01818 Encounter for other preprocedural examination: Secondary | ICD-10-CM | POA: Insufficient documentation

## 2012-09-17 DIAGNOSIS — M19019 Primary osteoarthritis, unspecified shoulder: Secondary | ICD-10-CM | POA: Insufficient documentation

## 2012-09-17 DIAGNOSIS — R52 Pain, unspecified: Secondary | ICD-10-CM

## 2012-09-17 DIAGNOSIS — M24419 Recurrent dislocation, unspecified shoulder: Secondary | ICD-10-CM | POA: Insufficient documentation

## 2012-09-17 DIAGNOSIS — S42143A Displaced fracture of glenoid cavity of scapula, unspecified shoulder, initial encounter for closed fracture: Secondary | ICD-10-CM | POA: Insufficient documentation

## 2012-10-07 ENCOUNTER — Emergency Department (HOSPITAL_COMMUNITY): Payer: Self-pay

## 2012-10-07 ENCOUNTER — Encounter (HOSPITAL_COMMUNITY): Payer: Self-pay

## 2012-10-07 ENCOUNTER — Emergency Department (HOSPITAL_COMMUNITY)
Admission: EM | Admit: 2012-10-07 | Discharge: 2012-10-07 | Disposition: A | Discharge status: Home / Self Care | Payer: Self-pay | Attending: Emergency Medicine | Admitting: Emergency Medicine

## 2012-10-07 DIAGNOSIS — Z791 Long term (current) use of non-steroidal anti-inflammatories (NSAID): Secondary | ICD-10-CM | POA: Insufficient documentation

## 2012-10-07 DIAGNOSIS — M25519 Pain in unspecified shoulder: Secondary | ICD-10-CM | POA: Insufficient documentation

## 2012-10-07 DIAGNOSIS — M25512 Pain in left shoulder: Secondary | ICD-10-CM

## 2012-10-07 DIAGNOSIS — F17200 Nicotine dependence, unspecified, uncomplicated: Secondary | ICD-10-CM | POA: Insufficient documentation

## 2012-10-07 DIAGNOSIS — Z8739 Personal history of other diseases of the musculoskeletal system and connective tissue: Secondary | ICD-10-CM | POA: Insufficient documentation

## 2012-10-07 MED ORDER — HYDROCODONE-ACETAMINOPHEN 5-325 MG PO TABS: 1.0000 | ORAL_TABLET | Freq: Four times a day (QID) | ORAL | Status: DC | PRN

## 2012-10-07 MED ORDER — OXYCODONE-ACETAMINOPHEN 5-325 MG PO TABS
2.0000 | ORAL_TABLET | Freq: Once | ORAL | Status: AC
  Administered 2012-10-07: 2 via ORAL
  Filled 2012-10-07: qty 2

## 2012-10-07 NOTE — ED Provider Notes (Signed)
Medical screening examination/treatment/procedure(s) were performed by non-physician practitioner and as supervising physician I was immediately available for consultation/collaboration. Devoria Albe, MD, Armando Gang   Ward Givens, MD 10/07/12 [phone]

## 2012-10-07 NOTE — ED Notes (Addendum)
Pt c/o shoulder pain/injury starting earlier today, after his dog ran into him.  Sts intital injury "earlier this month" and has surgery, with Ave Filter MD, scheduled 11/11.  Pain score 10/10.  Sts "I tried to call my doctor's office, but they were closed."  Sts surgery is for "shoulder stability."  Pt was not wear immobilizer, when collision happened.

## 2012-10-07 NOTE — ED Notes (Signed)
PA at bedside.

## 2012-10-07 NOTE — ED Provider Notes (Signed)
CSN: [id number]     Arrival date & time 10/07/12  1845 History   First MD Initiated Contact with Patient 10/07/12 1853     Chief Complaint  Patient presents with  . Shoulder Pain   (Consider location/radiation/quality/duration/timing/severity/associated sxs/prior Treatment) HPI Comments: Patient presents with a chief complaint of left shoulder pain.  He reports that he has a history of left shoulder pain and frequent dislocations of his left shoulder.  He is scheduled to have surgery of his shoulder on 11/16/12.  His Orthopedist is Dr. Ave Filter.  He states that the pain worsened this morning.  He was hit by his dog and the shoulder dislocated.  He was able to put the shoulder back in to place himself.  He reports that he has tried applying ice and heat to the area today.  He has also taken Naproxen and Tramadol without relief.  He is also complaining of intermittent numbness of the left hand.  He reports that the pain is worse with movement.    The history is provided by the patient.    Past Medical History  Diagnosis Date  . Shoulder dislocation    Past Surgical History  Procedure Laterality Date  . Hand surgery    . Hernia repair     History reviewed. No pertinent family history. History  Substance Use Topics  . Smoking status: Current Every Day Smoker -- 1.00 packs/day    Types: Cigarettes  . Smokeless tobacco: Not on file  . Alcohol Use: No     Comment: rarely    Review of Systems  Musculoskeletal:       Left shoulder pain  All other systems reviewed and are negative.    Allergies  Percocet; Tylenol; and Vicodin  Home Medications   Current Outpatient Rx  Name  Route  Sig  Dispense  Refill  . naproxen (NAPROSYN) 250 MG tablet   Oral   Take 250 mg by mouth 2 (two) times daily with a meal.         . traMADol (ULTRAM) 50 MG tablet   Oral   Take 1 tablet (50 mg total) by mouth every 6 (six) hours as needed for pain.   15 tablet   0    BP 122/71  Pulse 78   Temp(Src) 98.6 F (37 C) (Oral)  Resp 24  Ht 5\' 6"  (1.676 m)  Wt 135 lb (61.236 kg)  BMI 21.8 kg/m2  SpO2 100% Physical Exam  Nursing note and vitals reviewed. Constitutional: He appears well-developed and well-nourished.  HENT:  Head: Normocephalic and atraumatic.  Cardiovascular: Normal rate, regular rhythm and normal heart sounds.   Pulses:      Radial pulses are 2+ on the right side, and 2+ on the left side.  Pulmonary/Chest: Effort normal and breath sounds normal.  Musculoskeletal:       Left shoulder: He exhibits tenderness and bony tenderness. He exhibits no swelling, no deformity, no laceration and normal pulse.  ROM of left shoulder limited secondary to pain  Neurological: He is alert. No sensory deficit.  Skin: Skin is warm and dry. No bruising and no ecchymosis noted. No erythema.  Psychiatric: He has a normal mood and affect.    ED Course  Procedures (including critical care time) Labs Review Labs Reviewed - No data to display Imaging Review Dg Shoulder Left  10/07/2012   CLINICAL DATA:  Left shoulder pain. No known injury.  EXAM: LEFT SHOULDER - 2+ VIEW  COMPARISON:  None.  FINDINGS: There is no evidence of fracture or dislocation. There is no evidence of arthropathy or other focal bone abnormality. Soft tissues are unremarkable.  IMPRESSION: Negative.   Electronically Signed   By: Myles Rosenthal M.D.   On: 10/07/2012 19:31    MDM  No diagnosis found. Patient with a history of frequent shoulder dislocations presents today with pain of the left shoulder.  He reports that he dislocated his shoulder this morning, but was able to put it back in place.  Xray negative.  Patient neurovascularly intact.  Patient instructed to continue to wear shoulder immobilizer and follow up with his Orthopedist.  Patient given short course of pain medication.    Pascal Lux [place], PA-C 10/07/12 2127

## 2013-03-06 DIAGNOSIS — S43006A Unspecified dislocation of unspecified shoulder joint, initial encounter: Secondary | ICD-10-CM

## 2013-03-06 HISTORY — DX: Unspecified dislocation of unspecified shoulder joint, initial encounter: S43.006A

## 2013-03-10 ENCOUNTER — Other Ambulatory Visit: Payer: Self-pay | Admitting: Orthopedic Surgery

## 2013-03-15 ENCOUNTER — Encounter (HOSPITAL_BASED_OUTPATIENT_CLINIC_OR_DEPARTMENT_OTHER): Payer: Self-pay | Admitting: *Deleted

## 2013-03-21 ENCOUNTER — Encounter (HOSPITAL_BASED_OUTPATIENT_CLINIC_OR_DEPARTMENT_OTHER): Payer: Self-pay | Admitting: Anesthesiology

## 2013-03-21 ENCOUNTER — Ambulatory Visit (HOSPITAL_BASED_OUTPATIENT_CLINIC_OR_DEPARTMENT_OTHER): Payer: Self-pay | Admitting: Anesthesiology

## 2013-03-21 ENCOUNTER — Encounter (HOSPITAL_BASED_OUTPATIENT_CLINIC_OR_DEPARTMENT_OTHER)
Admission: RE | Disposition: A | Discharge status: Home / Self Care | Payer: Self-pay | Source: Ambulatory Visit | Attending: Orthopedic Surgery

## 2013-03-21 ENCOUNTER — Ambulatory Visit (HOSPITAL_BASED_OUTPATIENT_CLINIC_OR_DEPARTMENT_OTHER)
Admission: RE | Admit: 2013-03-21 | Discharge: 2013-03-21 | Disposition: A | Discharge status: Home / Self Care | Payer: Self-pay | Source: Ambulatory Visit | Attending: Orthopedic Surgery | Admitting: Orthopedic Surgery

## 2013-03-21 DIAGNOSIS — S43439A Superior glenoid labrum lesion of unspecified shoulder, initial encounter: Secondary | ICD-10-CM

## 2013-03-21 DIAGNOSIS — M24419 Recurrent dislocation, unspecified shoulder: Secondary | ICD-10-CM | POA: Insufficient documentation

## 2013-03-21 DIAGNOSIS — M24019 Loose body in unspecified shoulder: Secondary | ICD-10-CM | POA: Insufficient documentation

## 2013-03-21 DIAGNOSIS — F17200 Nicotine dependence, unspecified, uncomplicated: Secondary | ICD-10-CM | POA: Insufficient documentation

## 2013-03-21 DIAGNOSIS — M24819 Other specific joint derangements of unspecified shoulder, not elsewhere classified: Secondary | ICD-10-CM | POA: Insufficient documentation

## 2013-03-21 HISTORY — DX: Personal history of other diseases of the respiratory system: Z87.09

## 2013-03-21 HISTORY — PX: SHOULDER ARTHROSCOPY WITH LABRAL REPAIR: SHX5691

## 2013-03-21 SURGERY — ARTHROSCOPY, SHOULDER, WITH GLENOID LABRUM REPAIR
Anesthesia: General | Site: Shoulder | Laterality: Left

## 2013-03-21 MED ORDER — CEFAZOLIN SODIUM-DEXTROSE 2-3 GM-% IV SOLR
INTRAVENOUS | Status: AC
  Filled 2013-03-21: qty 50

## 2013-03-21 MED ORDER — ONDANSETRON HCL 4 MG/2ML IJ SOLN
INTRAMUSCULAR | Status: DC | PRN
  Administered 2013-03-21: 4 mg via INTRAVENOUS

## 2013-03-21 MED ORDER — DOCUSATE SODIUM 100 MG PO CAPS: 100.0000 mg | ORAL_CAPSULE | Freq: Three times a day (TID) | ORAL | Status: DC | PRN

## 2013-03-21 MED ORDER — FENTANYL CITRATE 0.05 MG/ML IJ SOLN
INTRAMUSCULAR | Status: AC
  Filled 2013-03-21: qty 6

## 2013-03-21 MED ORDER — OXYCODONE HCL 5 MG PO TABS: 5.0000 mg | ORAL_TABLET | Freq: Once | ORAL | Status: DC | PRN

## 2013-03-21 MED ORDER — CEFAZOLIN SODIUM-DEXTROSE 2-3 GM-% IV SOLR
INTRAVENOUS | Status: DC | PRN
  Administered 2013-03-21: 2 g via INTRAVENOUS

## 2013-03-21 MED ORDER — SUCCINYLCHOLINE CHLORIDE 20 MG/ML IJ SOLN
INTRAMUSCULAR | Status: DC | PRN
  Administered 2013-03-21: 50 mg via INTRAVENOUS

## 2013-03-21 MED ORDER — MIDAZOLAM HCL 2 MG/2ML IJ SOLN
INTRAMUSCULAR | Status: AC
  Filled 2013-03-21: qty 2

## 2013-03-21 MED ORDER — DEXAMETHASONE SODIUM PHOSPHATE 4 MG/ML IJ SOLN
INTRAMUSCULAR | Status: DC | PRN
  Administered 2013-03-21: 10 mg via INTRAVENOUS

## 2013-03-21 MED ORDER — BUPIVACAINE HCL (PF) 0.5 % IJ SOLN
INTRAMUSCULAR | Status: DC | PRN
  Administered 2013-03-21: 20 mL via PERINEURAL

## 2013-03-21 MED ORDER — MIDAZOLAM HCL 2 MG/ML PO SYRP: 12.0000 mg | ORAL_SOLUTION | Freq: Once | ORAL | Status: DC | PRN

## 2013-03-21 MED ORDER — FENTANYL CITRATE 0.05 MG/ML IJ SOLN
INTRAMUSCULAR | Status: DC | PRN
  Administered 2013-03-21: 50 ug via INTRAVENOUS
  Administered 2013-03-21 (×2): 25 ug via INTRAVENOUS

## 2013-03-21 MED ORDER — LIDOCAINE HCL (CARDIAC) 20 MG/ML IV SOLN
INTRAVENOUS | Status: DC | PRN
  Administered 2013-03-21: 40 mg via INTRAVENOUS

## 2013-03-21 MED ORDER — OXYCODONE HCL 5 MG/5ML PO SOLN: 5.0000 mg | Freq: Once | ORAL | Status: DC | PRN

## 2013-03-21 MED ORDER — MIDAZOLAM HCL 2 MG/2ML IJ SOLN
1.0000 mg | INTRAMUSCULAR | Status: DC | PRN
  Administered 2013-03-21: 2 mg via INTRAVENOUS

## 2013-03-21 MED ORDER — PROPOFOL 10 MG/ML IV BOLUS
INTRAVENOUS | Status: DC | PRN
  Administered 2013-03-21: 200 mg via INTRAVENOUS

## 2013-03-21 MED ORDER — METOCLOPRAMIDE HCL 5 MG/ML IJ SOLN: 10.0000 mg | Freq: Once | INTRAMUSCULAR | Status: DC | PRN

## 2013-03-21 MED ORDER — SODIUM CHLORIDE 0.9 % IR SOLN
Status: DC | PRN
  Administered 2013-03-21: 6100 mL

## 2013-03-21 MED ORDER — FENTANYL CITRATE 0.05 MG/ML IJ SOLN
INTRAMUSCULAR | Status: AC
  Filled 2013-03-21: qty 2

## 2013-03-21 MED ORDER — LACTATED RINGERS IV SOLN
INTRAVENOUS | Status: DC
  Administered 2013-03-21 (×3): via INTRAVENOUS

## 2013-03-21 MED ORDER — POVIDONE-IODINE 7.5 % EX SOLN: Freq: Once | CUTANEOUS | Status: DC

## 2013-03-21 MED ORDER — FENTANYL CITRATE 0.05 MG/ML IJ SOLN
50.0000 ug | INTRAMUSCULAR | Status: DC | PRN
  Administered 2013-03-21: 100 ug via INTRAVENOUS

## 2013-03-21 MED ORDER — OXYCODONE-ACETAMINOPHEN 5-325 MG PO TABS: 1.0000 | ORAL_TABLET | ORAL | Status: DC | PRN

## 2013-03-21 MED ORDER — HYDROMORPHONE HCL PF 1 MG/ML IJ SOLN: 0.2500 mg | INTRAMUSCULAR | Status: DC | PRN

## 2013-03-21 MED ORDER — CEFAZOLIN SODIUM-DEXTROSE 2-3 GM-% IV SOLR: 2.0000 g | INTRAVENOUS | Status: DC

## 2013-03-21 MED ORDER — MIDAZOLAM HCL 5 MG/5ML IJ SOLN
INTRAMUSCULAR | Status: DC | PRN
  Administered 2013-03-21: 2 mg via INTRAVENOUS

## 2013-03-21 SURGICAL SUPPLY — 91 items
ANCHOR SUT BIOCOMP LK 2.9X12.5 (Anchor) ×6 IMPLANT
BENZOIN TINCTURE PRP APPL 2/3 (GAUZE/BANDAGES/DRESSINGS) IMPLANT
BLADE SURG 15 STRL LF DISP TIS (BLADE) IMPLANT
BLADE SURG 15 STRL SS (BLADE)
BLADE SURG ROTATE 9660 (MISCELLANEOUS) IMPLANT
BUR 3.5 LG SPHERICAL (BURR) IMPLANT
BUR OVAL 4.0 (BURR) IMPLANT
BURR 3.5 LG SPHERICAL (BURR)
BURR 3.5MM LG SPHERICAL (BURR)
CANISTER SUCT 3000ML (MISCELLANEOUS) IMPLANT
CANNULA 5.75X71 LONG (CANNULA) ×3 IMPLANT
CANNULA TWIST IN 8.25X7CM (CANNULA) ×6 IMPLANT
CHLORAPREP W/TINT 26ML (MISCELLANEOUS) ×3 IMPLANT
CLOSURE WOUND 1/2 X4 (GAUZE/BANDAGES/DRESSINGS)
DECANTER SPIKE VIAL GLASS SM (MISCELLANEOUS) IMPLANT
DERMABOND ADVANCED (GAUZE/BANDAGES/DRESSINGS)
DERMABOND ADVANCED .7 DNX12 (GAUZE/BANDAGES/DRESSINGS) IMPLANT
DRAPE INCISE IOBAN 66X45 STRL (DRAPES) ×3 IMPLANT
DRAPE STERI 35X30 U-POUCH (DRAPES) ×3 IMPLANT
DRAPE SURG 17X23 STRL (DRAPES) ×3 IMPLANT
DRAPE U 20/CS (DRAPES) ×3 IMPLANT
DRAPE U-SHAPE 47X51 STRL (DRAPES) ×3 IMPLANT
DRAPE U-SHAPE 76X120 STRL (DRAPES) ×6 IMPLANT
ELECT REM PT RETURN 9FT ADLT (ELECTROSURGICAL)
ELECTRODE REM PT RTRN 9FT ADLT (ELECTROSURGICAL) IMPLANT
FIBERSTICK 2 (SUTURE) IMPLANT
GAUZE SPONGE 4X4 16PLY XRAY LF (GAUZE/BANDAGES/DRESSINGS) IMPLANT
GAUZE XEROFORM 1X8 LF (GAUZE/BANDAGES/DRESSINGS) ×3 IMPLANT
GLOVE BIO SURGEON STRL SZ7 (GLOVE) ×3 IMPLANT
GLOVE BIO SURGEON STRL SZ7.5 (GLOVE) ×3 IMPLANT
GLOVE BIOGEL M STRL SZ7.5 (GLOVE) ×3 IMPLANT
GLOVE BIOGEL PI IND STRL 7.0 (GLOVE) ×3 IMPLANT
GLOVE BIOGEL PI IND STRL 8 (GLOVE) ×2 IMPLANT
GLOVE BIOGEL PI INDICATOR 7.0 (GLOVE) ×6
GLOVE BIOGEL PI INDICATOR 8 (GLOVE) ×4
GLOVE ECLIPSE 6.5 STRL STRAW (GLOVE) ×3 IMPLANT
GOWN STRL REUS W/ TWL LRG LVL3 (GOWN DISPOSABLE) ×2 IMPLANT
GOWN STRL REUS W/ TWL XL LVL3 (GOWN DISPOSABLE) ×2 IMPLANT
GOWN STRL REUS W/TWL LRG LVL3 (GOWN DISPOSABLE) ×4
GOWN STRL REUS W/TWL XL LVL3 (GOWN DISPOSABLE) ×4
GOWN STRL REUS W/TWL XL LVL4 (GOWN DISPOSABLE) IMPLANT
KIT BIO-SUTURETAK 2.4 SPR TROC (KITS) IMPLANT
KIT DISPOSABLE PUSHLOCK 2.9MM (KITS) ×3 IMPLANT
LASSO 90 CVE QUICKPAS (DISPOSABLE) ×3 IMPLANT
LASSO CRESCENT QUICKPASS (SUTURE) IMPLANT
MANIFOLD NEPTUNE II (INSTRUMENTS) ×3 IMPLANT
NDL SUT 6 .5 CRC .975X.05 MAYO (NEEDLE) IMPLANT
NEEDLE 1/2 CIR CATGUT .05X1.09 (NEEDLE) IMPLANT
NEEDLE MAYO TAPER (NEEDLE)
NS IRRIG 1000ML POUR BTL (IV SOLUTION) IMPLANT
PACK ARTHROSCOPY DSU (CUSTOM PROCEDURE TRAY) ×3 IMPLANT
PACK BASIN DAY SURGERY FS (CUSTOM PROCEDURE TRAY) ×3 IMPLANT
PAD ABD 8X10 STRL (GAUZE/BANDAGES/DRESSINGS) ×3 IMPLANT
PENCIL BUTTON HOLSTER BLD 10FT (ELECTRODE) IMPLANT
RESECTOR FULL RADIUS 4.2MM (BLADE) ×3 IMPLANT
SHEET MEDIUM DRAPE 40X70 STRL (DRAPES) IMPLANT
SLEEVE SCD COMPRESS KNEE MED (MISCELLANEOUS) ×3 IMPLANT
SLING ARM IMMOBILIZER MED (SOFTGOODS) IMPLANT
SLING ARM LRG ADULT FOAM STRAP (SOFTGOODS) IMPLANT
SLING ARM MED ADULT FOAM STRAP (SOFTGOODS) IMPLANT
SLING ARM XL FOAM STRAP (SOFTGOODS) IMPLANT
SPONGE GAUZE 4X4 12PLY (GAUZE/BANDAGES/DRESSINGS) ×3 IMPLANT
SPONGE LAP 4X18 X RAY DECT (DISPOSABLE) IMPLANT
STRIP CLOSURE SKIN 1/2X4 (GAUZE/BANDAGES/DRESSINGS) IMPLANT
SUCTION FRAZIER TIP 10 FR DISP (SUCTIONS) IMPLANT
SUPPORT WRAP ARM LG (MISCELLANEOUS) IMPLANT
SUT ETHIBOND 2 OS 4 DA (SUTURE) IMPLANT
SUT ETHILON 3 0 PS 1 (SUTURE) ×3 IMPLANT
SUT ETHILON 4 0 PS 2 18 (SUTURE) IMPLANT
SUT FIBERWIRE #2 38 T-5 BLUE (SUTURE)
SUT FIBERWIRE 2-0 18 17.9 3/8 (SUTURE)
SUT MNCRL AB 3-0 PS2 18 (SUTURE) IMPLANT
SUT MNCRL AB 4-0 PS2 18 (SUTURE) IMPLANT
SUT PDS AB 0 CT 36 (SUTURE) IMPLANT
SUT PROLENE 3 0 PS 2 (SUTURE) IMPLANT
SUT VIC AB 0 CT1 27 (SUTURE)
SUT VIC AB 0 CT1 27XBRD ANBCTR (SUTURE) IMPLANT
SUT VIC AB 2-0 SH 27 (SUTURE)
SUT VIC AB 2-0 SH 27XBRD (SUTURE) IMPLANT
SUTURE FIBERWR #2 38 T-5 BLUE (SUTURE) IMPLANT
SUTURE FIBERWR 2-0 18 17.9 3/8 (SUTURE) IMPLANT
SYR BULB 3OZ (MISCELLANEOUS) IMPLANT
TAPE SUT LABRALTAP WHT/BLK (SUTURE) ×6 IMPLANT
TOWEL OR 17X24 6PK STRL BLUE (TOWEL DISPOSABLE) ×3 IMPLANT
TOWEL OR NON WOVEN STRL DISP B (DISPOSABLE) ×3 IMPLANT
TUBE CONNECTING 20'X1/4 (TUBING) ×1
TUBE CONNECTING 20X1/4 (TUBING) ×2 IMPLANT
TUBING ARTHROSCOPY IRRIG 16FT (MISCELLANEOUS) ×3 IMPLANT
WAND STAR VAC 90 (SURGICAL WAND) ×3 IMPLANT
WATER STERILE IRR 1000ML POUR (IV SOLUTION) ×3 IMPLANT
YANKAUER SUCT BULB TIP NO VENT (SUCTIONS) IMPLANT

## 2013-03-21 NOTE — Anesthesia Preprocedure Evaluation (Signed)
Anesthesia Evaluation  Patient identified by MRN, date of birth, ID band Patient awake    Reviewed: Allergy & Precautions, H&P , NPO status , Patient's Chart, lab work & pertinent test results, reviewed documented beta blocker date and time   Airway Mallampati: II TM Distance: >3 FB Neck ROM: full    Dental   Pulmonary asthma , Current Smoker,  breath sounds clear to auscultation        Cardiovascular negative cardio ROS  Rhythm:regular     Neuro/Psych negative neurological ROS  negative psych ROS   GI/Hepatic negative GI ROS, Neg liver ROS,   Endo/Other  negative endocrine ROS  Renal/GU negative Renal ROS  negative genitourinary   Musculoskeletal   Abdominal   Peds  Hematology negative hematology ROS (+)   Anesthesia Other Findings See surgeon's H&P   Reproductive/Obstetrics negative OB ROS                           Anesthesia Physical Anesthesia Plan  ASA: II  Anesthesia Plan: General   Post-op Pain Management:    Induction: Intravenous  Airway Management Planned: Oral ETT  Additional Equipment:   Intra-op Plan:   Post-operative Plan: Extubation in OR  Informed Consent: I have reviewed the patients History and Physical, chart, labs and discussed the procedure including the risks, benefits and alternatives for the proposed anesthesia with the patient or authorized representative who has indicated his/her understanding and acceptance.   Dental Advisory Given  Plan Discussed with: CRNA and Surgeon  Anesthesia Plan Comments:         Anesthesia Quick Evaluation

## 2013-03-21 NOTE — Transfer of Care (Signed)
Immediate Anesthesia Transfer of Care Note  Patient: Chase Olson  Procedure(s) Performed: Procedure(s): LEFT SHOULDER ARTHROSCOPY LABRAL REPAIR (Left)  Patient Location: PACU  Anesthesia Type:General  Level of Consciousness: awake and alert   Airway & Oxygen Therapy: Patient Spontanous Breathing and Patient connected to face mask oxygen  Post-op Assessment: Report given to PACU RN and Post -op Vital signs reviewed and stable  Post vital signs: Reviewed and stable  Complications: No apparent anesthesia complications

## 2013-03-21 NOTE — Anesthesia Postprocedure Evaluation (Signed)
Anesthesia Post Note  Patient: Chase Olson  Procedure(s) Performed: Procedure(s) (LRB): LEFT SHOULDER ARTHROSCOPY LABRAL REPAIR (Left)  Anesthesia type: General  Patient location: PACU  Post pain: Pain level controlled  Post assessment: Patient's Cardiovascular Status Stable  Last Vitals:  Filed Vitals:   03/21/13 1445  BP: 142/91  Pulse: 78  Temp:   Resp: 17    Post vital signs: Reviewed and stable  Level of consciousness: alert  Complications: No apparent anesthesia complications

## 2013-03-21 NOTE — Anesthesia Procedure Notes (Addendum)
Anesthesia Regional Block:  Interscalene brachial plexus block  Pre-Anesthetic Checklist: ,, timeout performed, Correct Patient, Correct Site, Correct Laterality, Correct Procedure, Correct Position, site marked, Risks and benefits discussed,  Surgical consent,  Pre-op evaluation,  At surgeon's request and post-op pain management  Laterality: Left  Prep: chloraprep       Needles:   Needle Type: Other     Needle Length: 9cm 9 cm Needle Gauge: 21 and 21 G    Additional Needles:  Procedures: ultrasound guided (picture in chart) Interscalene brachial plexus block Narrative:  Start time: 03/21/2013 12:00 PM End time: 03/21/2013 12:09 PM Injection made incrementally with aspirations every 5 mL.  Performed by: Personally  Anesthesiologist: Aldona Lento Frederick, MD  Additional Notes: Ultrasound guidance used to: id relevant anatomy, confirm needle position, local anesthetic spread, avoidance of vascular puncture. Picture saved. No complications. Block performed personally by Janetta Horaharles E. Gelene MinkFrederick, MD     Procedure Name: Intubation Date/Time: 03/21/2013 12:37 PM Performed by: [place] DesanctisLINKA, Grainger Mccarley L Pre-anesthesia Checklist: Patient identified, Emergency Drugs available, Suction available, Patient being monitored and Timeout performed Patient Re-evaluated:Patient Re-evaluated prior to inductionOxygen Delivery Method: Circle System Utilized Preoxygenation: Pre-oxygenation with 100% oxygen Intubation Type: IV induction Ventilation: Mask ventilation without difficulty Laryngoscope Size: Miller and 3 Grade View: Grade II Tube type: Oral Tube size: 8.0 mm Number of attempts: 1 Airway Equipment and Method: stylet and oral airway Placement Confirmation: ETT inserted through vocal cords under direct vision,  positive ETCO2 and breath sounds checked- equal and bilateral Secured at: 23 cm Tube secured with: Tape Dental Injury: Teeth and Oropharynx as per pre-operative assessment

## 2013-03-21 NOTE — Discharge Instructions (Signed)
Discharge Instructions after Arthroscopic Shoulder Repair ° ° °A sling has been provided for you. Remain in your sling at all times. This includes sleeping in your sling.  °Use ice on the shoulder intermittently over the first 48 hours after surgery.  °Pain medicine has been prescribed for you.  °Use your medicine liberally over the first 48 hours, and then you can begin to taper your use. You may take Extra Strength Tylenol or Tylenol only in place of the pain pills. DO NOT take ANY nonsteroidal anti-inflammatory pain medications: Advil, Motrin, Ibuprofen, Aleve, Naproxen, or Narprosyn.  °You may remove your dressing after two days. If the incision sites are still moist, place a Band-Aid over the moist site(s). Change Band-Aids daily until dry.  °You may shower 5 days after surgery. The incisions CANNOT get wet prior to 5 days. Simply allow the water to wash over the site and then pat dry. Do not rub the incisions. Make sure your axilla (armpit) is completely dry after showering.  °Take one aspirin a day for 2 weeks after surgery, unless you have an aspirin sensitivity/ allergy or asthma. ° ° °Please call [phone] during normal business hours or [phone] after hours for any problems. Including the following: ° °- excessive redness of the incisions °- drainage for more than 4 days °- fever of more than 101.5 F ° °*Please note that pain medications will not be refilled after hours or on weekends. ° ° ° °Post Anesthesia Home Care Instructions ° °Activity: °Get plenty of rest for the remainder of the day. A responsible adult should stay with you for 24 hours following the procedure.  °For the next 24 hours, DO NOT: °-Drive a car °-Operate machinery °-Drink alcoholic beverages °-Take any medication unless instructed by your physician °-Make any legal decisions or sign important papers. ° °Meals: °Start with liquid foods such as gelatin or soup. Progress to regular foods as tolerated. Avoid greasy, spicy, heavy  foods. If nausea and/or vomiting occur, drink only clear liquids until the nausea and/or vomiting subsides. Call your physician if vomiting continues. ° °Special Instructions/Symptoms: °Your throat may feel dry or sore from the anesthesia or the breathing tube placed in your throat during surgery. If this causes discomfort, gargle with warm salt water. The discomfort should disappear within 24 hours. ° ° °Regional Anesthesia Blocks ° °1. Numbness or the inability to move the "blocked" extremity may last from 3-48 hours after placement. The length of time depends on the medication injected and your individual response to the medication. If the numbness is not going away after 48 hours, call your surgeon. ° °2. The extremity that is blocked will need to be protected until the numbness is gone and the  Strength has returned. Because you cannot feel it, you will need to take extra care to avoid injury. Because it may be weak, you may have difficulty moving it or using it. You may not know what position it is in without looking at it while the block is in effect. ° °3. For blocks in the legs and feet, returning to weight bearing and walking needs to be done carefully. You will need to wait until the numbness is entirely gone and the strength has returned. You should be able to move your leg and foot normally before you try and bear weight or walk. You will need someone to be with you when you first try to ensure you do not fall and possibly risk injury. ° °4. Bruising and tenderness   at the needle site are common side effects and will resolve in a few days. ° °5. Persistent numbness or new problems with movement should be communicated to the surgeon or the [facility] ([phone])/ [facility] ([phone]). °

## 2013-03-21 NOTE — H&P (Signed)
Chase Olson is an 33 y.o. male.   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 Indicated for surgery to try and stabilize the shoulder and prevent recurrent dislocations.  Past Medical History  Diagnosis Date  . Shoulder dislocation 03/2013    left  . History of asthma     as a child    Past Surgical History  Procedure Laterality Date  . Inguinal hernia repair Right   . Hernia repair Right 2009    History reviewed. No pertinent family history. Social History:  reports that he has been smoking Cigarettes.  He has a 2.5 pack-year smoking history. He has never used smokeless tobacco. He reports that he drinks alcohol. He reports that he uses illicit drugs (Marijuana).  Allergies:  Allergies  Allergen Reactions  . Percocet [Oxycodone-Acetaminophen] Itching  . Tylenol [Acetaminophen] Itching  . Vicodin [Hydrocodone-Acetaminophen] Itching    Medications Prior to Admission  Medication Sig Dispense Refill  . acetaminophen (TYLENOL) 325 MG tablet Take 650 mg by mouth every 6 (six) hours as needed.        No results found for this or any previous visit (from the past 48 hour(s)). No results found.  Review of Systems  All other systems reviewed and are negative.    Blood pressure 138/100, pulse 76, temperature 98.7 F (37.1 C), temperature source Oral, resp. rate 14, height 5\' 6"  (1.676 m), weight 59.875 kg (132 lb), SpO2 100.00%. Physical Exam  Constitutional: He is oriented to person, place, and time. He appears well-developed and well-nourished.  HENT:  Head: Atraumatic.  Eyes: EOM are normal.  Cardiovascular: Intact distal pulses.   Respiratory: Effort normal.  Musculoskeletal:  Left shoulder skin intact. He has anterior apprehension. Distally neurovascularly intact.  Neurological: He is alert and oriented to person, place, and time.  Skin: Skin is warm and  dry.  Psychiatric: He has a normal mood and affect.     Assessment/Plan Left shoulder recurrent instability Plan arthroscopic examination with likely labral tear, possible remplissage Risks / benefits of surgery discussed Consent on chart  NPO for OR Preop antibiotics   Chase Olson WILLIAM 03/21/2013, 12:29 PM

## 2013-03-21 NOTE — Op Note (Signed)
Procedure(s): LEFT SHOULDER ARTHROSCOPY LABRAL REPAIR Procedure Note  Chase Olson male 33 y.o. 03/21/2013  Procedure(s) and Anesthesia Type:   #1 left shoulder arthroscopic Bankart repair   #2 left shoulder arthroscopic removal loose bodies  Surgeon(s) and Role:    * Mable ParisJustin William Burel Kahre, MD - Primary     Surgeon: Mable ParisHANDLER,Aseneth Hack WILLIAM   Assistants: Damita Lackanielle Lalibert PA-C [facility](Danielle was present and scrubbed throughout the procedure and was essential in positioning, assisting with the camera and instrumentation,, and closure)  Anesthesia: General endotracheal anesthesia with preoperative interscalene block    Procedure Detail  LEFT SHOULDER ARTHROSCOPY LABRAL REPAIR  Estimated Blood Loss: Min         Drains: none  Blood Given: none         Specimens: none        Complications:  * No complications entered in OR log *         Disposition: PACU - hemodynamically stable.         Condition: stable    Procedure:   INDICATIONS FOR SURGERY: The patient is 33 y.o. male who has had multiple dislocations of left shoulder over the past several years. He was indicated for surgery to try and restore stability to the shoulder. He had a preoperative CT scan which was significant for a small Hill-Sachs lesion as well as some blunting of the anterior glenoid but no severe glenoid bone loss.  OPERATIVE FINDINGS: Examination under anesthesia: Grade 3 anterior instability Diagnostic Arthroscopy:  Glenoid articular cartilage: Anteriorly he had mild anterior glenoid bone loss, approximately 15% anterior inferior, but had more significant cartilage loss involving the anterior one third of the glenoid. There were several small cartilaginous loose bodies in the joint which were removed. Humeral head articular cartilage: He had a small moderate size Hill-Sachs defect posteriorly. Not engaging. Labrum: Anterior-inferior labrum was diminutive and medially healed. Loose bodies: Multiple  small chondral loose bodies Synovitis: Moderate Articular sided rotator cuff: Intact   DESCRIPTION OF PROCEDURE: The patient was identified in preoperative  holding area where I personally marked the operative site after  verifying site, side, and procedure with the patient. An interscalene block was given by the attending anesthesiologist the holding area.  The patient was taken back to the operating room where general anesthesia was induced without complication and was placed in the beach-chair position with the back  elevated about 60 degrees and all extremities and head and neck carefully padded and  positioned.   The left upper extremity was then prepped and  draped in a standard sterile fashion. The appropriate time-out  procedure was carried out. The patient did receive IV antibiotics  within 30 minutes of incision.   A small posterior portal incision was made and the arthroscope was introduced into the joint. An anterior portal was then established above the subscapularis using needle localization. Small cannula was placed anteriorly. Diagnostic arthroscopy was then carried out with findings as described above.  Rotator cuff is intact. Superior labrum and biceps tendon were intact. There were several small cartilaginous loose bodies in axillary recess which were largely removed with suction and a small grasper. Anterior-inferior glenoid was deficient as noted above in findings. He had cartilage loss throughout the anterior-inferior one third but probably only about 15% actual bone loss. Therefore I felt that an arthroscopic repair was a reasonable option for him. The high lateral rotator interval portal was established and the camera was moved to this position. The anterior-inferior soft tissues were carefully mobilized  with an arthroscopic elevator off of the anterior glenoid. Soft tissue quality was fair. Once mobilized the anterior glenoid was roughened with a rasp to promote healing.  A labral tape was passed inferiorly in the region of the inferior glenohumeral ligament and around the inferior labrum at about the 7:00 position. This was then placed in a 2.9 mm push lock anchor brought up onto the face adjacent to the articular cartilage advancing superiorly and medially. This tightened the inferior capsular structures nicely. Secondly tape was placed superior to the first and again placed slightly onto the glenoid face further reducing and tightening the anterior capsule and labrum. The repair was viewed from the anterior superior portal and from posteriorly and felt to be complete.  The arthroscopic equipment was removed from the joint and the portals were closed with 3-0 nylon in an interrupted fashion. Sterile dressings were then applied including Xeroform 4 x 4's ABDs and tape. The patient was then allowed to awaken from general anesthesia, placed in a sling, transferred to the stretcher and taken to the recovery room in stable condition.   POSTOPERATIVE PLAN: The patient will be discharged home today and will followup in one week for suture removal and wound check.

## 2013-03-21 NOTE — Progress Notes (Signed)
Assisted Dr. Frederick with left, ultrasound guided, interscalene  block. Side rails up, monitors on throughout procedure. See vital signs in flow sheet. Tolerated Procedure well. 

## 2013-03-22 LAB — POCT HEMOGLOBIN-HEMACUE: Hemoglobin: 15.4 g/dL (ref 13.0–17.0)

## 2013-03-23 ENCOUNTER — Encounter (HOSPITAL_BASED_OUTPATIENT_CLINIC_OR_DEPARTMENT_OTHER): Payer: Self-pay | Admitting: Orthopedic Surgery

## 2013-05-31 ENCOUNTER — Emergency Department (HOSPITAL_BASED_OUTPATIENT_CLINIC_OR_DEPARTMENT_OTHER)
Admission: EM | Admit: 2013-05-31 | Discharge: 2013-05-31 | Disposition: A | Discharge status: Home / Self Care | Payer: Self-pay | Attending: Emergency Medicine | Admitting: Emergency Medicine

## 2013-05-31 ENCOUNTER — Encounter (HOSPITAL_BASED_OUTPATIENT_CLINIC_OR_DEPARTMENT_OTHER): Payer: Self-pay | Admitting: Emergency Medicine

## 2013-05-31 DIAGNOSIS — F17200 Nicotine dependence, unspecified, uncomplicated: Secondary | ICD-10-CM | POA: Insufficient documentation

## 2013-05-31 DIAGNOSIS — Z79899 Other long term (current) drug therapy: Secondary | ICD-10-CM | POA: Insufficient documentation

## 2013-05-31 DIAGNOSIS — J45909 Unspecified asthma, uncomplicated: Secondary | ICD-10-CM | POA: Insufficient documentation

## 2013-05-31 DIAGNOSIS — K0889 Other specified disorders of teeth and supporting structures: Secondary | ICD-10-CM

## 2013-05-31 DIAGNOSIS — K089 Disorder of teeth and supporting structures, unspecified: Secondary | ICD-10-CM | POA: Insufficient documentation

## 2013-05-31 DIAGNOSIS — Z8739 Personal history of other diseases of the musculoskeletal system and connective tissue: Secondary | ICD-10-CM | POA: Insufficient documentation

## 2013-05-31 MED ORDER — BUPIVACAINE-EPINEPHRINE (PF) 0.5% -1:200000 IJ SOLN
INTRAMUSCULAR | Status: AC
  Filled 2013-05-31: qty 1.8

## 2013-05-31 MED ORDER — OXYCODONE HCL 5 MG PO TABS: 5.0000 mg | ORAL_TABLET | ORAL | Status: DC | PRN

## 2013-05-31 MED ORDER — BUPIVACAINE-EPINEPHRINE (PF) 0.5% -1:200000 IJ SOLN
1.8000 mL | Freq: Once | INTRAMUSCULAR | Status: AC
  Administered 2013-05-31: 1.8 mL

## 2013-05-31 NOTE — Discharge Instructions (Signed)

## 2013-05-31 NOTE — ED Notes (Signed)
Left side tooth pain

## 2013-05-31 NOTE — ED Provider Notes (Signed)
CSN: [id number]     Arrival date & time 05/31/13  0056 History   First MD Initiated Contact with Patient 05/31/13 0131     Chief Complaint  Patient presents with  . Dental Pain     (Consider location/radiation/quality/duration/timing/severity/associated sxs/prior Treatment) HPI This is a 33 year old male who fractured his left upper second premolar on bleeding yesterday afternoon. He is subsequently developed severe pain in that tooth. He took ibuprofen with some relief but the pain is subsequently worsened. Pain is worse with eating or drinking. He has a subjective sense of swelling around the tooth. He has started taking the amoxicillin which he has at home; he states he does not need another prescription for it.  Past Medical History  Diagnosis Date  . Shoulder dislocation 03/2013    left  . History of asthma     as a child   Past Surgical History  Procedure Laterality Date  . Inguinal hernia repair Right   . Hernia repair Right 2009  . Shoulder arthroscopy with labral repair Left 03/21/2013    Procedure: LEFT SHOULDER ARTHROSCOPY LABRAL REPAIR;  Surgeon: Mable Paris, MD;  Location: [facility];  Service: Orthopedics;  Laterality: Left;   History reviewed. No pertinent family history. History  Substance Use Topics  . Smoking status: Current Every Day Smoker -- 0.50 packs/day for 5 years    Types: Cigarettes  . Smokeless tobacco: Never Used  . Alcohol Use: Yes     Comment: rarely    Review of Systems  All other systems reviewed and are negative.  Allergies  Tylenol and Vicodin  Home Medications   Prior to Admission medications   Medication Sig Start Date End Date Taking? Authorizing Provider  docusate sodium (COLACE) 100 MG capsule Take 1 capsule (100 mg total) by mouth 3 (three) times daily as needed. 03/21/13   Jiles Harold, PA-C  oxyCODONE-acetaminophen (ROXICET) 5-325 MG per tablet Take 1-2 tablets by mouth every 4 (four) hours as  needed for severe pain. 03/21/13   Danielle Laliberte, PA-C   BP 132/99  Pulse 80  Temp(Src) 98.4 F (36.9 C) (Oral)  Resp 16  SpO2 98%  Physical Exam General: Well-developed, well-nourished male in no acute distress; appearance consistent with age of record HENT: normocephalic; atraumatic; fractured left upper second premolar with tenderness to percussion Eyes: pupils equal, round and reactive to light; extraocular muscles intact Neck: supple; no lymphadenopathy Heart: regular rate and rhythm Lungs: Normal respiratory effort and excursion Abdomen: soft; nondistended Extremities: No deformity; full range of motion Neurologic: Awake, alert and oriented; motor function intact in all extremities and symmetric; no facial droop Skin: Warm and dry Psychiatric: Normal mood and affect    ED Course  Procedures (including critical care time)  DENTAL BLOCK 1.8 mL of 0.5% bupivacaine with epinephrine was injected into the buccal fold adjacent to the left upper second premolar. The patient tolerated this well and there were no immediate complications. Adequate analgesia was obtained.  MDM      Hanley Seamen, MD 05/31/13 [phone]

## 2013-05-31 NOTE — ED Notes (Signed)
Left upper tooth pain  States took ibu and some amoxicillin  w some relief  The pain got worse,   Has known for 2 years that had a bad tooth

## 2013-06-27 ENCOUNTER — Emergency Department (HOSPITAL_BASED_OUTPATIENT_CLINIC_OR_DEPARTMENT_OTHER)
Admission: EM | Admit: 2013-06-27 | Discharge: 2013-06-27 | Disposition: A | Discharge status: Home / Self Care | Payer: Self-pay | Attending: Emergency Medicine | Admitting: Emergency Medicine

## 2013-06-27 ENCOUNTER — Encounter (HOSPITAL_BASED_OUTPATIENT_CLINIC_OR_DEPARTMENT_OTHER): Payer: Self-pay | Admitting: Emergency Medicine

## 2013-06-27 DIAGNOSIS — R220 Localized swelling, mass and lump, head: Secondary | ICD-10-CM | POA: Insufficient documentation

## 2013-06-27 DIAGNOSIS — T7840XA Allergy, unspecified, initial encounter: Secondary | ICD-10-CM

## 2013-06-27 DIAGNOSIS — Z8781 Personal history of (healed) traumatic fracture: Secondary | ICD-10-CM | POA: Insufficient documentation

## 2013-06-27 DIAGNOSIS — J45909 Unspecified asthma, uncomplicated: Secondary | ICD-10-CM | POA: Insufficient documentation

## 2013-06-27 DIAGNOSIS — R221 Localized swelling, mass and lump, neck: Principal | ICD-10-CM

## 2013-06-27 DIAGNOSIS — T4995XA Adverse effect of unspecified topical agent, initial encounter: Secondary | ICD-10-CM | POA: Insufficient documentation

## 2013-06-27 DIAGNOSIS — F17200 Nicotine dependence, unspecified, uncomplicated: Secondary | ICD-10-CM | POA: Insufficient documentation

## 2013-06-27 MED ORDER — PREDNISONE 10 MG PO TABS: ORAL_TABLET | ORAL | Status: DC

## 2013-06-27 MED ORDER — METHYLPREDNISOLONE SODIUM SUCC 125 MG IJ SOLR
125.0000 mg | Freq: Once | INTRAMUSCULAR | Status: AC
  Administered 2013-06-27: 125 mg via INTRAMUSCULAR
  Filled 2013-06-27: qty 2

## 2013-06-27 MED ORDER — DIPHENHYDRAMINE HCL 50 MG/ML IJ SOLN
25.0000 mg | Freq: Once | INTRAMUSCULAR | Status: AC
  Administered 2013-06-27: 25 mg via INTRAMUSCULAR
  Filled 2013-06-27: qty 1

## 2013-06-27 NOTE — ED Provider Notes (Signed)
Medical screening examination/treatment/procedure(s) were performed by non-physician practitioner and as supervising physician I was immediately available for consultation/collaboration.   EKG Interpretation None        David H Yao, MD 06/27/13 2339 

## 2013-06-27 NOTE — ED Provider Notes (Signed)
CSN: [id number]     Arrival date & time 06/27/13  1327 History   First MD Initiated Contact with Patient 06/27/13 1522     Chief Complaint  Patient presents with  . Allergic Reaction     (Consider location/radiation/quality/duration/timing/severity/associated sxs/prior Treatment) HPI Comments: Pt states that he woke up with swelling to the left side of face and lower lip. States that has had a previous episode. Pt states that he tried benadryl with partial relief. unknown new exposure. Not on ace. No oral swelling or problems breathing  Patient is a 33 y.o. male presenting with allergic reaction. The history is provided by the patient. No language interpreter was used.  Allergic Reaction Presenting symptoms: swelling   Presenting symptoms: no difficulty breathing   Severity:  Moderate Relieved by:  Nothing Worsened by:  Nothing tried Ineffective treatments:  Antihistamines   Past Medical History  Diagnosis Date  . Shoulder dislocation 03/2013    left  . History of asthma     as a child   Past Surgical History  Procedure Laterality Date  . Inguinal hernia repair Right   . Hernia repair Right 2009  . Shoulder arthroscopy with labral repair Left 03/21/2013    Procedure: LEFT SHOULDER ARTHROSCOPY LABRAL REPAIR;  Surgeon: Mable ParisJustin William Chandler, MD;  Location: [facility];  Service: Orthopedics;  Laterality: Left;   No family history on file. History  Substance Use Topics  . Smoking status: Current Every Day Smoker -- 0.50 packs/day for 5 years    Types: Cigarettes  . Smokeless tobacco: Never Used  . Alcohol Use: Yes     Comment: rarely    Review of Systems  Constitutional: Negative.   Respiratory: Negative.   Cardiovascular: Negative.       Allergies  Tylenol and Vicodin  Home Medications   Prior to Admission medications   Medication Sig Start Date End Date Taking? Authorizing Iyesha Such  docusate sodium (COLACE) 100 MG capsule Take 1 capsule (100  mg total) by mouth 3 (three) times daily as needed. 03/21/13   [name], PA-C  oxyCODONE (OXY IR/ROXICODONE) 5 MG immediate release tablet Take 1 tablet (5 mg total) by mouth every 4 (four) hours as needed (for pain). 05/31/13   Carlisle BeersJohn L Molpus, MD  oxyCODONE-acetaminophen (ROXICET) 5-325 MG per tablet Take 1-2 tablets by mouth every 4 (four) hours as needed for severe pain. 03/21/13   Danielle Laliberte, PA-C   BP 102/66  Pulse 78  Temp(Src) 98.3 F (36.8 C) (Oral)  Resp 18  Ht 5\' 6"  (1.676 m)  Wt 132 lb (59.875 kg)  BMI 21.32 kg/m2  SpO2 99% Physical Exam  Nursing note and vitals reviewed. Constitutional: He is oriented to person, place, and time. He appears well-developed and well-nourished.  HENT:  Right Ear: External ear normal.  Left Ear: External ear normal.  Mouth/Throat: Oropharynx is clear and moist.  Swelling noted to the right lower lip and left face. No oral involvement noted.  Eyes: EOM are normal.  Neck: Neck supple.  Cardiovascular: Normal rate and regular rhythm.   Pulmonary/Chest: Effort normal and breath sounds normal. No respiratory distress. He has no wheezes.  Neurological: He is alert and oriented to person, place, and time.  Skin: Skin is warm and dry.    ED Course  Procedures (including critical care time) Labs Review Labs Reviewed - No data to display  Imaging Review No results found.   EKG Interpretation None      MDM  Final diagnoses:  Allergic reaction, initial encounter    Pt is feeling better and swelling has decreased after benadryl and prednisone. Discussed allergist follow up with pt:no airway involvement    [name], NP 06/27/13 1648

## 2013-06-27 NOTE — ED Notes (Signed)
Woke with lower lip swelling and swelling to this left side of his face. He took Benadryl and went back to sleep. When he woke the swelling was no better.

## 2013-06-27 NOTE — Discharge Instructions (Signed)
Continue to use benadryl as discussed °Allergies ° Allergies may happen from anything your body is sensitive to. This may be food, medicines, pollens, chemicals, and many other things. Food allergies can be severe and deadly.  °HOME CARE °· If you do not know what causes a reaction, keep a diary. Write down the foods you ate and the symptoms that followed. Avoid foods that cause reactions. °· If you have red raised spots (hives) or a rash: °¨ Take medicine as told by your doctor. °¨ Use medicines for red raised spots and itching as needed. °¨ Apply cold cloths (compresses) to the skin. Take a cool bath. Avoid hot baths or showers. °· If you are severely allergic: °¨ It is often necessary to go to the hospital after you have treated your reaction. °¨ Wear your medical alert jewelry. °¨ You and your family must learn how to give a allergy shot or use an allergy kit (anaphylaxis kit). °¨ Always carry your allergy kit or shot with you. Use this medicine as told by your doctor if a severe reaction is occurring. °GET HELP RIGHT AWAY IF: °· You have trouble breathing or are making high-pitched whistling sounds (wheezing). °· You have a tight feeling in your chest or throat. °· You have a puffy (swollen) mouth. °· You have red raised spots, puffiness (swelling), or itching all over your body. °· You have had a severe reaction that was helped by your allergy kit or shot. The reaction can return once the medicine has worn off. °· You think you are having a food allergy. Symptoms most often happen within 30 minutes of eating a food. °· Your symptoms have not gone away within 2 days or are getting worse. °· You have new symptoms. °· You want to retest yourself with a food or drink you think causes an allergic reaction. Only do this under the care of a doctor. °MAKE SURE YOU:  °· Understand these instructions. °· Will watch your condition. °· Will get help right away if you are not doing well or get worse. °Document Released:  04/19/2012 Document Reviewed: 04/19/2012 °[employer]® Patient Information ©2015 [employer]. This information is not intended to replace advice given to you by your health care provider. Make sure you discuss any questions you have with your health care provider. ° °

## 2013-12-08 ENCOUNTER — Encounter (HOSPITAL_BASED_OUTPATIENT_CLINIC_OR_DEPARTMENT_OTHER): Payer: Self-pay | Admitting: Emergency Medicine

## 2013-12-08 ENCOUNTER — Emergency Department (HOSPITAL_BASED_OUTPATIENT_CLINIC_OR_DEPARTMENT_OTHER)
Admission: EM | Admit: 2013-12-08 | Discharge: 2013-12-08 | Disposition: A | Discharge status: Home / Self Care | Payer: Self-pay | Attending: Emergency Medicine | Admitting: Emergency Medicine

## 2013-12-08 DIAGNOSIS — K047 Periapical abscess without sinus: Secondary | ICD-10-CM | POA: Insufficient documentation

## 2013-12-08 DIAGNOSIS — Z792 Long term (current) use of antibiotics: Secondary | ICD-10-CM | POA: Insufficient documentation

## 2013-12-08 DIAGNOSIS — J45909 Unspecified asthma, uncomplicated: Secondary | ICD-10-CM | POA: Insufficient documentation

## 2013-12-08 DIAGNOSIS — Z7952 Long term (current) use of systemic steroids: Secondary | ICD-10-CM | POA: Insufficient documentation

## 2013-12-08 DIAGNOSIS — Z87828 Personal history of other (healed) physical injury and trauma: Secondary | ICD-10-CM | POA: Insufficient documentation

## 2013-12-08 DIAGNOSIS — K0381 Cracked tooth: Secondary | ICD-10-CM | POA: Insufficient documentation

## 2013-12-08 DIAGNOSIS — Z720 Tobacco use: Secondary | ICD-10-CM | POA: Insufficient documentation

## 2013-12-08 DIAGNOSIS — K029 Dental caries, unspecified: Secondary | ICD-10-CM | POA: Insufficient documentation

## 2013-12-08 DIAGNOSIS — H9209 Otalgia, unspecified ear: Secondary | ICD-10-CM | POA: Insufficient documentation

## 2013-12-08 MED ORDER — IBUPROFEN 800 MG PO TABS: 800.0000 mg | ORAL_TABLET | Freq: Three times a day (TID) | ORAL | Status: DC

## 2013-12-08 MED ORDER — AMOXICILLIN 500 MG PO CAPS
1000.0000 mg | ORAL_CAPSULE | Freq: Once | ORAL | Status: AC
  Administered 2013-12-08: 1000 mg via ORAL
  Filled 2013-12-08: qty 2

## 2013-12-08 MED ORDER — AMOXICILLIN 500 MG PO TABS: 1000.0000 mg | ORAL_TABLET | Freq: Two times a day (BID) | ORAL | Status: DC

## 2013-12-08 MED ORDER — TRAMADOL HCL 50 MG PO TABS: 100.0000 mg | ORAL_TABLET | Freq: Four times a day (QID) | ORAL | Status: DC | PRN

## 2013-12-08 MED ORDER — TRAMADOL HCL 50 MG PO TABS
100.0000 mg | ORAL_TABLET | Freq: Once | ORAL | Status: AC
Start: 2013-12-08 — End: 2013-12-08
  Administered 2013-12-08: 100 mg via ORAL
  Filled 2013-12-08: qty 2

## 2013-12-08 MED ORDER — IBUPROFEN 800 MG PO TABS
800.0000 mg | ORAL_TABLET | Freq: Once | ORAL | Status: AC
  Administered 2013-12-08: 800 mg via ORAL
  Filled 2013-12-08: qty 1

## 2013-12-08 NOTE — Discharge Instructions (Signed)
Dental Abscess °A dental abscess is a collection of infected fluid (pus) from a bacterial infection in the inner part of the tooth (pulp). It usually occurs at the end of the tooth's root.  °CAUSES  °· Severe tooth decay. °· Trauma to the tooth that allows bacteria to enter into the pulp, such as a broken or chipped tooth. °SYMPTOMS  °· Severe pain in and around the infected tooth. °· Swelling and redness around the abscessed tooth or in the mouth or face. °· Tenderness. °· Pus drainage. °· Bad breath. °· Bitter taste in the mouth. °· Difficulty swallowing. °· Difficulty opening the mouth. °· Nausea. °· Vomiting. °· Chills. °· Swollen neck glands. °DIAGNOSIS  °· A medical and dental history will be taken. °· An examination will be performed by tapping on the abscessed tooth. °· X-rays may be taken of the tooth to identify the abscess. °TREATMENT °The goal of treatment is to eliminate the infection. You may be prescribed antibiotic medicine to stop the infection from spreading. A root canal may be performed to save the tooth. If the tooth cannot be saved, it may be pulled (extracted) and the abscess may be drained.  °HOME CARE INSTRUCTIONS °· Only take over-the-counter or prescription medicines for pain, fever, or discomfort as directed by your caregiver. °· Rinse your mouth (gargle) often with salt water (¼ tsp salt in 8 oz [250 ml] of warm water) to relieve pain or swelling. °· Do not drive after taking pain medicine (narcotics). °· Do not apply heat to the outside of your face. °· Return to your dentist for further treatment as directed. °SEEK MEDICAL CARE IF: °· Your pain is not helped by medicine. °· Your pain is getting worse instead of better. °SEEK IMMEDIATE MEDICAL CARE IF: °· You have a fever or persistent symptoms for more than 2-3 days. °· You have a fever and your symptoms suddenly get worse. °· You have chills or a very bad headache. °· You have problems breathing or swallowing. °· You have trouble  opening your mouth. °· You have swelling in the neck or around the eye. °Document Released: 12/23/2004 Document Revised: 09/17/2011 Document Reviewed: 04/02/2010 °ExitCare® Patient Information ©2015 [employer]. This information is not intended to replace advice given to you by your health care provider. Make sure you discuss any questions you have with your health care provider. ° ° °Emergency Department Resource Guide °1) Find a Doctor and Pay Out of Pocket °Although you won't have to find out who is covered by your insurance plan, it is a good idea to ask around and get recommendations. You will then need to call the office and see if the doctor you have chosen will accept you as a new patient and what types of options they offer for patients who are self-pay. Some doctors offer discounts or will set up payment plans for their patients who do not have insurance, but you will need to ask so you aren't surprised when you get to your appointment. ° °2) Contact Your Local Health Department °Not all health departments have doctors that can see patients for sick visits, but many do, so it is worth a call to see if yours does. If you don't know where your local health department is, you can check in your phone book. The CDC also has a tool to help you locate your state's health department, and many state websites also have listings of all of their local health departments. ° °3) Find a Walk-in   Clinic °If your illness is not likely to be very severe or complicated, you may want to try a walk in clinic. These are popping up all over the country in pharmacies, drugstores, and shopping centers. They're usually staffed by nurse practitioners or physician assistants that have been trained to treat common illnesses and complaints. They're usually fairly quick and inexpensive. However, if you have serious medical issues or chronic medical problems, these are probably not your best option. ° °No Primary Care Doctor: °- Call  [facility] at  832-8000 - they can help you locate a primary care doctor that  accepts your insurance, provides certain services, etc. °- Physician Referral Service- 1-800-533-3463 ° °Chronic Pain Problems: °Organization         Address  Phone   Notes  °[facility]  (336) 297-2271 Patients need to be referred by their primary care doctor.  ° °Medication Assistance: °Organization         Address  Phone   Notes  °[place] [facility] [address] °[place], [place] [address] (336) 641-8030 --Must be a resident of [place] °-- Must have NO insurance coverage whatsoever (no Medicaid/ Medicare, etc.) °-- The pt. MUST have a primary care doctor that directs their care regularly and follows them in the community °  °[facility]  (866) 331-1348   °[employer]  (888) 892-1162   ° °Agencies that provide inexpensive medical care: °Organization         Address  Phone   Notes  °[facility]  (336) 832-8035   °[facility]    (336) 832-7272   °[facility] [address] °[place], [place] [address] (336) 832-4777   °[facility] [address], °[place] (336) 271-4999   °[facility]    (336) 373-0678   °[facility]    (336) 272-1050   °[facility] ° [address], [place] Phone:  (336) 832-4444, Fax:  (336) 832-4440 Hours of Operation:  9 am - 6 pm, M-F.  Also accepts Medicaid/Medicare and self-pay.  °[facility] ° [address], [place] Phone: (336) 832-3150, Fax: (336) 832-3151. Hours of Operation:  8:30 am - 5:30 pm, M-F.  Also accepts Medicaid and self-pay.  °[facility] [place] [address], [place] Phone: (336) 878-6027   °[facility] [address], [place], [place] (336)723-1848, Ext. 123 Mondays & Thursdays: 7-9 AM.  First 15 patients are seen on a first come, first serve  basis. °  ° °Medicaid-accepting [place] Providers: ° °Organization         Address  Phone   Notes  °[facility] [address], [place] (336) 641-2100 Also accepts self-pay patients.  °[facility] [address], [place] ° (336) 856-9996   °[facility] [address], [place] (336) 288-8857   °[facility] [address] [place] [address], [place] (336) 299-7000   °Veita Bland [address], [place]  ° (336) 373-1557 Only accepts [place] Access Medicaid patients after they have their name applied to their card.  ° °Self-Pay (no insurance) in [place]: ° °Organization         Address  Phone   Notes  °Sickle Cell Patients, [facility] [address], [place] (  336) 832-1970   °[facility] [address], [place] (336) 832-4400   °[facility] ° [address], [place] (336) 992-4800   °[facility]/Dr. Osei-Bonsu ° [address] [place] [address], [place] or [address], [place] (336) 841-8500 Phone number for both [place] and [place] locations is the same.  °[facility] [address], [place] (336) 299-0000   °[facility] [address] [place] [address], [place] or [address] (336) 852-7530 °(336) 878-2260   °[facility] [address], [place] (336) 350-1642, phone; (336) 294-5005, fax Sees patients 1st and 3rd Saturday of every month.  Must not qualify for public or private insurance (i.e. Medicaid, Medicare, [place] Health Choice, Veterans' Benefits) • Household income should be no more than 200% of the poverty level •The clinic cannot treat you if you are pregnant or think you are pregnant • Sexually transmitted diseases are not treated at the clinic.  ° ° °Dental Care: °Organization         Address  Phone  Notes  °[place]  [facility] [address], [place] (336) 641-6152 Accepts children up to age 21 who are enrolled in Medicaid or [place] Health Choice; pregnant women with a Medicaid card; and children who have applied for Medicaid or [place] Health Choice, but were declined, whose parents can pay a reduced fee at time of service.  °[facility] [place]  [address], [place] (336) 641-7733 Accepts children up to age 21 who are enrolled in Medicaid or [place] Health Choice; pregnant women with a Medicaid card; and children who have applied for Medicaid or [place] Health Choice, but were declined, whose parents can pay a reduced fee at time of service.  °[facility] ° [address], [place] (336) 641-4533 Patients are seen by appointment only. Walk-ins are not accepted. [facility] will see patients 18 years of age and older. °Monday - Tuesday (8am-5pm) °Most Wednesdays (8:30-5pm) °$30 per visit, cash only  °[facility] ° [address], [place] (336) 641-4533 Patients are seen by appointment only. Walk-ins are not accepted. [facility] will see patients 18 years of age and older. °One Wednesday Evening (Monthly: Volunteer Based).  $30 per visit, cash only  °[facility]  (919) 537-3737 for adults; Children under age 4, call Graduate Pediatric Dentistry at (919) [phone]. Children aged 4-14, please call (919) [phone] to request a pediatric application. ° Dental services are provided in all areas of dental care including fillings, crowns and bridges, complete and partial dentures, implants, gum treatment, root canals, and extractions. Preventive care is also provided. Treatment is provided to both adults and children. °Patients are selected via a lottery and there is often a waiting list. °  °[facility] [address], °[place] ° (336) 763-8833  [email or web address] °  °[facility] [address], [place], [place] (336)723-1848, Ext. 123 Second and Fourth Thursday of each month, opens at 6:30 AM; Clinic ends at 9 AM.  Patients are seen on a first-come first-served basis, and a limited number are seen during each clinic.  ° °[facility] ° [address], [place],  (336) 723-7904   Eligibility Requirements °You must have lived in [place], [place], or [place] counties   for at least the last three months. °  You cannot be eligible for state or federal sponsored healthcare insurance, including Veterans Administration, Medicaid, or Medicare. °  You generally cannot be eligible for healthcare insurance through your employer.  °  How to apply: °Eligibility screenings are held every Tuesday and Wednesday afternoon from 1:00 pm until 4:00 pm. You do not need an appointment for the interview!  °[facility] [address], [place], [place] 336-631-2330   °[facility]  336-342-8273   °[facility]  336-703-3100   °[facility]  336-570-6415   ° °Behavioral Health Resources in the Community: °Intensive Outpatient Programs °Organization         Address  Phone  Notes  °[facility] [address], [place], [place] 336-878-6098   °[facility] [address], [place], [place] 336-832-9800   °[facility] [address], [place], [place] ° 336-882-2125   °[facility] [address],  °[place], [place] 1-800-853-5163 or 336-641-4981   °Substance Abuse Resources °Organization         Address  Phone  Notes  °[facility]  336-882-2125   °[facility]  336-784-9470   °[facility]  336-285-9073   °[facility]  336-845-3988   °Residential & Outpatient Substance Abuse Program  1-800-659-3381   °Psychological Services °Organization          Address  Phone  Notes  °[facility]  336- 832-9600   °[facility]  336- 378-7881   °[facility] [address], [place] 1-800-853-5163 or 336-641-4981   ° °Mobile Crisis Teams °Organization         Address  Phone  Notes  °[facility], Mobile Crisis Care Unit  1-877-626-1772   °Assertive °[facility] ° [address] [place], [place] 336-834-9664   °Sharon DeEsch [address] °[place]  336-554-5454   ° °Self-Help/Support Groups °Organization         Address  Phone             Notes  °[facility] - variety of support groups  336- 373-1402 Call for more information  °Narcotics Anonymous (NA), [facility] [address], °[place]  2 meetings at this location  ° °Residential Treatment Programs °Organization         Address  Phone  Notes  °[facility] [address],    °[place]  1-866-801-8205   °[facility] ° [address], [place], [place] 704-293-8524   °[facility] [address], [place] 336-845-3988 Admissions: 8am-3pm M-F  °[facility] [address]    °[place], [place] 336-841-1104   °[facility] [address], [place], [place] 336-379-7146   °[facility] [address]  °[place], [place] 336-285-9073   °[facility] Intensive Outpatient [address], [place], [place] 336-852-3033   °[facility] [address]  °[place], [place] 1-877-615-2722 or 336-784-9470   °[facility] [address] [place], [place] 336-227-7417 Accepts Medicaid  °[facility] [address]  °[place]  1-800-659-3381 Substance Abuse/Addiction Treatment  ° °[place] Behavioral Health Resources °Organization         Address  Phone  Notes  °[facility]  (888)   581-9988   °Julie Brannon, PhD [address] [place], [place]   (336) 349-5553 or (336) 951-0000   °[facility]   [address] °[place], [place] (336) 349-4454   °[facility] [address], [place], [place] (336) 342-8316 Insurance/Medicaid/sponsorship through [facility]  °[facility] [address]                                    [place], [place] (336) 342-8316 Therapy/tele-psych/case  °[facility] [address]  ° [place], [place] (336) 349-2233    °Dr. Arfeen  (336) 349-4544   °[facility] [place]  [employer] [place] [facility] 1) [address],  °2) [address], [place] °3)  [address], [place] (336) 349-3220 °(336) 342-7768 ° °(336) 342-8140   °[place] [facility] (336) 342-1394 or (336) 342-3537 (After Hours)    ° ° ° °

## 2013-12-08 NOTE — ED Provider Notes (Signed)
CSN: [id number]     Arrival date & time 12/08/13  1939 History  This chart was scribed for Chase Olson Iyan Flett, MD by Evon Slackerrance Branch, ED Scribe. This patient was seen in room MH11/MH11 and the patient's care was started at 7:50 PM.    Chief Complaint  Patient presents with  . Dental Pain   The history is provided by the patient. No language interpreter was used.   HPI Comments: Chase Olson is a 33 y.o. male who presents to the Emergency Department complaining of gradually worsening upper left sided dental pain onset 1 year ago. He states that the pain recently worsened over the past 3 days. Pt states that he has associated left sided facial swelling and ear pain. Pt states that he had some left over amoxicillin from a year ago that has provided some relief. Pt denies fever. He reports he did have facial swelling yesterday but that actually seems to have improved. He reports he took about 3 days of the amoxicillin at 500 mg 3 times a day and now he has run out.  Past Medical History  Diagnosis Date  . Shoulder dislocation 03/2013    left  . History of asthma     as a child   Past Surgical History  Procedure Laterality Date  . Inguinal hernia repair Right   . Hernia repair Right 2009  . Shoulder arthroscopy with labral repair Left 03/21/2013    Procedure: LEFT SHOULDER ARTHROSCOPY LABRAL REPAIR;  Surgeon: Mable ParisJustin William Chandler, MD;  Location:  SURGERY CENTER;  Service: Orthopedics;  Laterality: Left;   History reviewed. No pertinent family history. History  Substance Use Topics  . Smoking status: Current Every Day Smoker -- 0.50 packs/day for 5 years    Types: Cigarettes  . Smokeless tobacco: Never Used  . Alcohol Use: Yes     Comment: rarely    Review of Systems  Constitutional: Negative for fever.  HENT: Positive for dental problem, ear pain and facial swelling.     Allergies  Tylenol and Vicodin  Home Medications   Prior to Admission medications   Medication  Sig Start Date End Date Taking? Authorizing Provider  amoxicillin (AMOXIL) 500 MG tablet Take 2 tablets (1,000 mg total) by mouth 2 (two) times daily. 12/08/13   Chase Olson Lashonna Rieke, MD  docusate sodium (COLACE) 100 MG capsule Take 1 capsule (100 mg total) by mouth 3 (three) times daily as needed. 03/21/13   Chase Haroldanielle Laliberte, PA-C  ibuprofen (ADVIL,MOTRIN) 800 MG tablet Take 1 tablet (800 mg total) by mouth 3 (three) times daily. 12/08/13   Chase Olson Chase Cush, MD  oxyCODONE (OXY IR/ROXICODONE) 5 MG immediate release tablet Take 1 tablet (5 mg total) by mouth every 4 (four) hours as needed (for pain). 05/31/13   Chase BeersJohn L Molpus, MD  oxyCODONE-acetaminophen (ROXICET) 5-325 MG per tablet Take 1-2 tablets by mouth every 4 (four) hours as needed for severe pain. 03/21/13   Chase Haroldanielle Laliberte, PA-C  predniSONE (DELTASONE) 10 MG tablet 6 day step down dose 06/27/13   [name], NP  traMADol (ULTRAM) 50 MG tablet Take 2 tablets (100 mg total) by mouth every 6 (six) hours as needed. 12/08/13   Chase Olson Jaileen Janelle, MD   Triage Vitals: BP 122/77 mmHg  Pulse 99  Temp(Src) 99.3 F (37.4 C) (Oral)  Resp 20  Ht 5\' 6"  (1.676 m)  Wt 140 lb (63.504 kg)  BMI 22.61 kg/m2  SpO2 99%  Physical Exam  Constitutional: He is oriented to person, place, and  time. He appears well-developed and well-nourished.  The patient is nontoxic but appears to be in moderate pain.  HENT:  Right Ear: External ear normal.  Left Ear: External ear normal.  Nose: Nose normal.  Mouth/Throat: Oropharynx is clear and moist.  The patient has some mild facial swelling over the left nasolabial fold area. This is a tender region just inferior to the zygoma. The patient has no trismus. Extraocular motions are normal. No periorbital swelling. The intraoral examination shows a significantly decayed and fractured tooth at the first upper left premolar. The posterior oropharynx is widely patent.  Neck: Normal range of motion. Neck supple.  Lymphadenopathy:     He has no cervical adenopathy.  Neurological: He is alert and oriented to person, place, and time. No cranial nerve deficit. Coordination normal.  Skin: Skin is warm and dry.  Psychiatric: He has a normal mood and affect.    ED Course  Procedures (including critical care time) DIAGNOSTIC STUDIES: Oxygen Saturation is 99% on RA, normal by my interpretation.    COORDINATION OF CARE: 8:02 PM-Discussed treatment plan which includes antibiotics with pt at bedside and pt agreed to plan.     Labs Review Labs Reviewed - No data to display  Imaging Review No results found.   EKG Interpretation None      MDM   Final diagnoses:  Apical abscess   The patient has an area of chronic decay. He has gotten an exacerbation over about the past 3 days. The patient had been getting improvement on leftover amoxicillin. At this point he does still have some residual facial swelling and tenderness consistent with an apical abscess. The patient will be continued on amoxicillin and treated for pain. He is counseled on the importance of dental follow-up for tooth extraction due to risk of recurrent and severe facial infection.  I personally performed the services described in this documentation, which was scribed in my presence. The recorded information has been reviewed and is accurate.     Chase Olson Savino Whisenant, MD 12/08/13 2013

## 2013-12-08 NOTE — ED Notes (Signed)
Patient states that his left mouth is hurting from tooth pain. Patient has some swelling noted to his left upper jaw

## 2014-01-09 ENCOUNTER — Encounter (HOSPITAL_BASED_OUTPATIENT_CLINIC_OR_DEPARTMENT_OTHER): Payer: Self-pay

## 2014-01-09 ENCOUNTER — Emergency Department (HOSPITAL_BASED_OUTPATIENT_CLINIC_OR_DEPARTMENT_OTHER): Payer: Self-pay

## 2014-01-09 ENCOUNTER — Emergency Department (HOSPITAL_BASED_OUTPATIENT_CLINIC_OR_DEPARTMENT_OTHER)
Admission: EM | Admit: 2014-01-09 | Discharge: 2014-01-09 | Disposition: A | Discharge status: Home / Self Care | Payer: Self-pay | Attending: Emergency Medicine | Admitting: Emergency Medicine

## 2014-01-09 DIAGNOSIS — M79641 Pain in right hand: Secondary | ICD-10-CM

## 2014-01-09 DIAGNOSIS — S6991XA Unspecified injury of right wrist, hand and finger(s), initial encounter: Secondary | ICD-10-CM | POA: Insufficient documentation

## 2014-01-09 DIAGNOSIS — Y9289 Other specified places as the place of occurrence of the external cause: Secondary | ICD-10-CM | POA: Insufficient documentation

## 2014-01-09 DIAGNOSIS — Z791 Long term (current) use of non-steroidal anti-inflammatories (NSAID): Secondary | ICD-10-CM | POA: Insufficient documentation

## 2014-01-09 DIAGNOSIS — Z720 Tobacco use: Secondary | ICD-10-CM | POA: Insufficient documentation

## 2014-01-09 DIAGNOSIS — Z792 Long term (current) use of antibiotics: Secondary | ICD-10-CM | POA: Insufficient documentation

## 2014-01-09 DIAGNOSIS — W541XXA Struck by dog, initial encounter: Secondary | ICD-10-CM | POA: Insufficient documentation

## 2014-01-09 DIAGNOSIS — R52 Pain, unspecified: Secondary | ICD-10-CM

## 2014-01-09 DIAGNOSIS — Y9389 Activity, other specified: Secondary | ICD-10-CM | POA: Insufficient documentation

## 2014-01-09 DIAGNOSIS — Y998 Other external cause status: Secondary | ICD-10-CM | POA: Insufficient documentation

## 2014-01-09 DIAGNOSIS — J45909 Unspecified asthma, uncomplicated: Secondary | ICD-10-CM | POA: Insufficient documentation

## 2014-01-09 HISTORY — DX: Unspecified asthma, uncomplicated: J45.909

## 2014-01-09 MED ORDER — OXYCODONE-ACETAMINOPHEN 5-325 MG PO TABS
2.0000 | ORAL_TABLET | Freq: Once | ORAL | Status: AC
Start: 2014-01-09 — End: 2014-01-09
  Administered 2014-01-09: 2 via ORAL
  Filled 2014-01-09: qty 2

## 2014-01-09 NOTE — ED Notes (Signed)
Pt reports 3 weeks ago hit the dogs head on right middle knuckle, has had pain since this time, worse today, noted swelling to same knuckle.

## 2014-01-09 NOTE — ED Provider Notes (Signed)
CSN: [id number]     Arrival date & time 01/09/14  1912 History  This chart was scribed for Mirian Mo, MD by [place] Peace, ED Scribe. The patient was seen in [facility]03/[facility]. The patient's care was started at 8:41 PM.    Chief Complaint  Patient presents with  . Hand Pain      Patient is a 34 y.o. male presenting with hand pain. The history is provided by the patient. No language interpreter was used.  Hand Pain This is a new problem. The current episode started more than 1 week ago (3 weeks ago). The problem occurs constantly. The problem has been gradually improving. The symptoms are aggravated by bending.    HPI Comments: Chase Olson is a 34 y.o. male who presents to the Emergency Department complaining of right hand injury that occurred 3 weeks ago from accidentally hitting a dog's head. Pt reports pain is specifically to right middle knuckle, right ring finger, and right wrist pain. He reports swelling initially after incident that he states had been improving until today where he went to pick up his son and reports he felt something "pop". No complaints of fever, nausea, or vomiting. History of right broken hand about 2 or 3 years ago. Pt is current everyday smoker.    Past Medical History  Diagnosis Date  . Shoulder dislocation 03/2013    left  . History of asthma     as a child  . Asthma    Past Surgical History  Procedure Laterality Date  . Inguinal hernia repair Right   . Hernia repair Right 2009  . Shoulder arthroscopy with labral repair Left 03/21/2013    Procedure: LEFT SHOULDER ARTHROSCOPY LABRAL REPAIR;  Surgeon: Mable Paris, MD;  Location: [facility];  Service: Orthopedics;  Laterality: Left;   No family history on file. History  Substance Use Topics  . Smoking status: Current Every Day Smoker -- 0.50 packs/day for 5 years    Types: Cigarettes  . Smokeless tobacco: Never Used  . Alcohol Use: Yes     Comment: rarely    Review of  Systems  Constitutional: Negative for fever.  Gastrointestinal: Negative for nausea and vomiting.  Musculoskeletal: Positive for arthralgias.       Right hand pain and wrist pain with swelling.   All other systems reviewed and are negative.     Allergies  Tylenol and Vicodin  Home Medications   Prior to Admission medications   Medication Sig Start Date End Date Taking? Authorizing Provider  amoxicillin (AMOXIL) 500 MG tablet Take 2 tablets (1,000 mg total) by mouth 2 (two) times daily. 12/08/13   Arby Barrette, MD  docusate sodium (COLACE) 100 MG capsule Take 1 capsule (100 mg total) by mouth 3 (three) times daily as needed. 03/21/13   Jiles Harold, PA-C  ibuprofen (ADVIL,MOTRIN) 800 MG tablet Take 1 tablet (800 mg total) by mouth 3 (three) times daily. 12/08/13   Arby Barrette, MD  oxyCODONE (OXY IR/ROXICODONE) 5 MG immediate release tablet Take 1 tablet (5 mg total) by mouth every 4 (four) hours as needed (for pain). 05/31/13   Carlisle Beers Molpus, MD  oxyCODONE-acetaminophen (ROXICET) 5-325 MG per tablet Take 1-2 tablets by mouth every 4 (four) hours as needed for severe pain. 03/21/13   Jiles Harold, PA-C  predniSONE (DELTASONE) 10 MG tablet 6 day step down dose 06/27/13   Teressa Lower, NP  traMADol (ULTRAM) 50 MG tablet Take 2 tablets (100 mg total) by mouth  every 6 (six) hours as needed. 12/08/13   Arby Barrette, MD   BP 133/77 mmHg  Pulse 72  Temp(Src) 98.4 F (36.9 C) (Oral)  Resp 16  Ht  (1.676 m)  Wt 140 lb (63.504 kg)  BMI 22.61 kg/m2  SpO2 100% Physical Exam  Constitutional: He is oriented to person, place, and time. He appears well-developed and well-nourished.  HENT:  Head: Normocephalic and atraumatic.  Eyes: Conjunctivae and EOM are normal.  Neck: Normal range of motion. Neck supple.  Cardiovascular: Normal rate, regular rhythm and normal heart sounds.   Pulmonary/Chest: Effort normal and breath sounds normal. No respiratory distress.  Abdominal:  He exhibits no distension. There is no tenderness. There is no rebound and no guarding.  Musculoskeletal: Normal range of motion.  Tenderness over 4th metacarpal of R hand with swelling of 3rd MCP.  No warmth, erythema, or other signs of illness.  FROM.  Neurological: He is alert and oriented to person, place, and time.  Skin: Skin is warm and dry.  Vitals reviewed.   ED Course  Procedures (including critical care time) Labs Review Labs Reviewed - No data to display  Imaging Review Dg Hand Complete Right  01/09/2014   CLINICAL DATA:  Right hand pain. Injury. Pain over fourth metacarpal.  EXAM: RIGHT HAND - COMPLETE 3+ VIEW  COMPARISON:  10/14/2010  FINDINGS: No fracture or dislocation. The alignment and joint spaces are maintained. Particularly, fourth metacarpal is intact. No erosion or periosteal reaction. No localizing soft tissue abnormality.  IMPRESSION: No fracture or dislocation of the right hand.   Electronically Signed   By: Rubye Oaks M.D.   On: 01/09/2014 19:57     EKG Interpretation None     Medications - No data to display  8:44 PM- Treatment plan was discussed with patient who verbalizes understanding and agrees.   MDM   Final diagnoses:  Pain    34 y.o. male with pertinent PMH of asthma presents with right hand pain after trauma 3 weeks ago. No open injuries at that time. No history of arthritis. On arrival today vitals signs and physical exam as above. X-ray obtained and unremarkable. Patient has intact sensation, full range of motion. No signs of septic arthritis or other occult pathology.  Patient placed in a wrist splint and will follow-up with hand. Discharged home in stable condition with standard return precautions..    I have reviewed all laboratory and imaging studies if ordered as above  1. Right hand pain   2. Pain       I personally performed the services described in this documentation, which was scribed in my presence. The recorded information  has been reviewed and is accurate.   Mirian Mo, MD 01/10/14 [phone]

## 2014-01-09 NOTE — Discharge Instructions (Signed)

## 2014-01-09 NOTE — ED Notes (Addendum)
inj to rt middle knuckle 3 weeks ago  Increased swelling,  Increased pain

## 2014-04-30 ENCOUNTER — Emergency Department (HOSPITAL_COMMUNITY): Payer: Medicaid Other

## 2014-04-30 ENCOUNTER — Encounter (HOSPITAL_COMMUNITY): Payer: Self-pay | Admitting: Emergency Medicine

## 2014-04-30 ENCOUNTER — Emergency Department (HOSPITAL_COMMUNITY)
Admission: EM | Admit: 2014-04-30 | Discharge: 2014-04-30 | Disposition: A | Discharge status: Home / Self Care | Payer: Medicaid Other | Attending: Emergency Medicine | Admitting: Emergency Medicine

## 2014-04-30 DIAGNOSIS — Z87828 Personal history of other (healed) physical injury and trauma: Secondary | ICD-10-CM | POA: Insufficient documentation

## 2014-04-30 DIAGNOSIS — Z720 Tobacco use: Secondary | ICD-10-CM | POA: Diagnosis not present

## 2014-04-30 DIAGNOSIS — R0602 Shortness of breath: Secondary | ICD-10-CM | POA: Diagnosis present

## 2014-04-30 DIAGNOSIS — Z7952 Long term (current) use of systemic steroids: Secondary | ICD-10-CM | POA: Insufficient documentation

## 2014-04-30 DIAGNOSIS — E876 Hypokalemia: Secondary | ICD-10-CM | POA: Insufficient documentation

## 2014-04-30 DIAGNOSIS — J45909 Unspecified asthma, uncomplicated: Secondary | ICD-10-CM | POA: Insufficient documentation

## 2014-04-30 DIAGNOSIS — Z79899 Other long term (current) drug therapy: Secondary | ICD-10-CM | POA: Insufficient documentation

## 2014-04-30 DIAGNOSIS — Z792 Long term (current) use of antibiotics: Secondary | ICD-10-CM | POA: Insufficient documentation

## 2014-04-30 DIAGNOSIS — R000 Tachycardia, unspecified: Secondary | ICD-10-CM | POA: Diagnosis not present

## 2014-04-30 DIAGNOSIS — Z791 Long term (current) use of non-steroidal anti-inflammatories (NSAID): Secondary | ICD-10-CM | POA: Diagnosis not present

## 2014-04-30 LAB — COMPREHENSIVE METABOLIC PANEL
ALT: 21 U/L (ref 0–53)
ANION GAP: 15 (ref 5–15)
AST: 39 U/L — AB (ref 0–37)
Albumin: 4.5 g/dL (ref 3.5–5.2)
Alkaline Phosphatase: 90 U/L (ref 39–117)
BILIRUBIN TOTAL: 0.8 mg/dL (ref 0.3–1.2)
BUN: 14 mg/dL (ref 6–23)
CALCIUM: 10.2 mg/dL (ref 8.4–10.5)
CHLORIDE: 99 mmol/L (ref 96–112)
CO2: 22 mmol/L (ref 19–32)
CREATININE: 1.18 mg/dL (ref 0.50–1.35)
GFR calc Af Amer: >90 mL/min (ref >90)
GFR, EST NON AFRICAN AMERICAN: 79 mL/min — AB (ref >90)
Glucose, Bld: 112 mg/dL — ABNORMAL HIGH (ref 70–99)
POTASSIUM: 3.1 mmol/L — AB (ref 3.5–5.1)
Sodium: 136 mmol/L (ref 135–145)
Total Protein: 7.6 g/dL (ref 6.0–8.3)

## 2014-04-30 LAB — URINALYSIS, ROUTINE W REFLEX MICROSCOPIC
Bilirubin Urine: NEGATIVE
Glucose, UA: NEGATIVE mg/dL
HGB URINE DIPSTICK: NEGATIVE
Ketones, ur: 15 mg/dL — AB
Leukocytes, UA: NEGATIVE
Nitrite: NEGATIVE
PROTEIN: NEGATIVE mg/dL
Specific Gravity, Urine: 1.013 (ref 1.005–1.030)
Urobilinogen, UA: 0.2 mg/dL (ref 0.0–1.0)
pH: 7 (ref 5.0–8.0)

## 2014-04-30 LAB — CBC WITH DIFFERENTIAL/PLATELET
BASOS ABS: 0 10*3/uL (ref 0.0–0.1)
Basophils Relative: 0 % (ref 0–1)
EOS ABS: 0 10*3/uL (ref 0.0–0.7)
EOS PCT: 0 % (ref 0–5)
HCT: 43.2 % (ref 39.0–52.0)
Hemoglobin: 14.7 g/dL (ref 13.0–17.0)
Lymphocytes Relative: 14 % (ref 12–46)
Lymphs Abs: 1.9 10*3/uL (ref 0.7–4.0)
MCH: 28.7 pg (ref 26.0–34.0)
MCHC: 34 g/dL (ref 30.0–36.0)
MCV: 84.4 fL (ref 78.0–100.0)
Monocytes Absolute: 1.5 10*3/uL — ABNORMAL HIGH (ref 0.1–1.0)
Monocytes Relative: 12 % (ref 3–12)
NEUTROS PCT: 74 % (ref 43–77)
Neutro Abs: 9.6 10*3/uL — ABNORMAL HIGH (ref 1.7–7.7)
Platelets: 356 10*3/uL (ref 150–400)
RBC: 5.12 MIL/uL (ref 4.22–5.81)
RDW: 13.9 % (ref 11.5–15.5)
WBC: 13 10*3/uL — ABNORMAL HIGH (ref 4.0–10.5)

## 2014-04-30 LAB — I-STAT TROPONIN, ED: Troponin i, poc: 0 ng/mL (ref 0.00–0.08)

## 2014-04-30 LAB — RAPID URINE DRUG SCREEN, HOSP PERFORMED
AMPHETAMINES: NOT DETECTED
BARBITURATES: NOT DETECTED
Benzodiazepines: NOT DETECTED
Cocaine: POSITIVE — AB
OPIATES: NOT DETECTED
TETRAHYDROCANNABINOL: POSITIVE — AB

## 2014-04-30 LAB — TROPONIN I: Troponin I: <0.03 ng/mL (ref <0.031)

## 2014-04-30 LAB — D-DIMER, QUANTITATIVE: D-Dimer, Quant: <0.27 ug/mL-FEU (ref 0.00–0.48)

## 2014-04-30 MED ORDER — ASPIRIN 325 MG PO TABS
325.0000 mg | ORAL_TABLET | Freq: Once | ORAL | Status: AC
  Administered 2014-04-30: 325 mg via ORAL
  Filled 2014-04-30: qty 1

## 2014-04-30 MED ORDER — NITROGLYCERIN 0.4 MG SL SUBL: 0.4000 mg | SUBLINGUAL_TABLET | SUBLINGUAL | Status: DC | PRN

## 2014-04-30 MED ORDER — POTASSIUM CHLORIDE CRYS ER 20 MEQ PO TBCR
40.0000 meq | EXTENDED_RELEASE_TABLET | Freq: Once | ORAL | Status: AC
  Administered 2014-04-30: 40 meq via ORAL
  Filled 2014-04-30: qty 2

## 2014-04-30 MED ORDER — MORPHINE SULFATE 4 MG/ML IJ SOLN
4.0000 mg | Freq: Once | INTRAMUSCULAR | Status: AC
  Administered 2014-04-30: 4 mg via INTRAVENOUS
  Filled 2014-04-30: qty 1

## 2014-04-30 MED ORDER — DEXAMETHASONE SODIUM PHOSPHATE 10 MG/ML IJ SOLN
10.0000 mg | Freq: Once | INTRAMUSCULAR | Status: AC
  Administered 2014-04-30: 10 mg via INTRAMUSCULAR
  Filled 2014-04-30: qty 1

## 2014-04-30 MED ORDER — NAPROXEN 375 MG PO TABS: 375.0000 mg | ORAL_TABLET | Freq: Two times a day (BID) | ORAL | Status: DC

## 2014-04-30 MED ORDER — IBUPROFEN 800 MG PO TABS
800.0000 mg | ORAL_TABLET | Freq: Once | ORAL | Status: AC
  Administered 2014-04-30: 800 mg via ORAL
  Filled 2014-04-30: qty 1

## 2014-04-30 MED ORDER — POTASSIUM CHLORIDE ER 10 MEQ PO TBCR: 10.0000 meq | EXTENDED_RELEASE_TABLET | Freq: Every day | ORAL | Status: DC

## 2014-04-30 MED ORDER — DIPHENHYDRAMINE HCL 50 MG/ML IJ SOLN
INTRAMUSCULAR | Status: AC
  Administered 2014-04-30: 25 mg
  Filled 2014-04-30: qty 1

## 2014-04-30 NOTE — ED Notes (Signed)
Per EMS- pt was at a friends moving a television, began having sob with left arm numbness, hr at 130's initially, at 107 now. 12 lead unremarkable, elevation in V3. 20 g LAC. Hx of asthma, no other med hx. No prescription medications. A/O x4. Pt admits taking a "bump" of cocaine. Denies cp.

## 2014-04-30 NOTE — ED Notes (Signed)
Pt states "i feel like I can't swallow"; reports having morphine given to him in the past and does not report any allergies. [facility] and given verbal order to give 25 mg of benadryl; VSS at this time. Airway intact. No obvious swelling noted. Lungs clear.

## 2014-04-30 NOTE — ED Notes (Signed)
Pt suddenly complaining of left shoulder pain. Pt states his fiance told him he fell and hit his shoulder earlier this afternoon. This has not been mentioned until this current time. Pt cursing while telling this to me.

## 2014-04-30 NOTE — Discharge Instructions (Signed)
Hypokalemia Hypokalemia means that the amount of potassium in the blood is lower than normal.Potassium is a chemical, called an electrolyte, that helps regulate the amount of fluid in the body. It also stimulates muscle contraction and helps nerves function properly.Most of the body's potassium is inside of cells, and only a very small amount is in the blood. Because the amount in the blood is so small, minor changes can be life-threatening. CAUSES  Antibiotics.  Diarrhea or vomiting.  Using laxatives too much, which can cause diarrhea.  Chronic kidney disease.  Water pills (diuretics).  Eating disorders (bulimia).  Low magnesium level.  Sweating a lot. SIGNS AND SYMPTOMS  Weakness.  Constipation.  Fatigue.  Muscle cramps.  Mental confusion.  Skipped heartbeats or irregular heartbeat (palpitations).  Tingling or numbness. DIAGNOSIS  Your health care provider can diagnose hypokalemia with blood tests. In addition to checking your potassium level, your health care provider may also check other lab tests. TREATMENT Hypokalemia can be treated with potassium supplements taken by mouth or adjustments in your current medicines. If your potassium level is very low, you may need to get potassium through a vein (IV) and be monitored in the hospital. A diet high in potassium is also helpful. Foods high in potassium are:  Nuts, such as peanuts and pistachios.  Seeds, such as sunflower seeds and pumpkin seeds.  Peas, lentils, and lima beans.  Whole grain and bran cereals and breads.  Fresh fruit and vegetables, such as apricots, avocado, bananas, cantaloupe, kiwi, oranges, tomatoes, asparagus, and potatoes.  Orange and tomato juices.  Red meats.  Fruit yogurt. HOME CARE INSTRUCTIONS  Take all medicines as prescribed by your health care provider.  Maintain a healthy diet by including nutritious food, such as fruits, vegetables, nuts, whole grains, and lean meats.  If  you are taking a laxative, be sure to follow the directions on the label. SEEK MEDICAL CARE IF:  Your weakness gets worse.  You feel your heart pounding or racing.  You are vomiting or having diarrhea.  You are diabetic and having trouble keeping your blood glucose in the normal range. SEEK IMMEDIATE MEDICAL CARE IF:  You have chest pain, shortness of breath, or dizziness.  You are vomiting or having diarrhea for more than 2 days.  You faint. MAKE SURE YOU:   Understand these instructions.  Will watch your condition.  Will get help right away if you are not doing well or get worse. Document Released: 12/23/2004 Document Revised: 10/13/2012 Document Reviewed: 06/25/2012 [facility] Patient Information 2015 [employer], [place]. This information is not intended to replace advice given to you by your health care provider. Make sure you discuss any questions you have with your health care provider. Shortness of Breath Shortness of breath means you have trouble breathing. It could also mean that you have a medical problem. You should get immediate medical care for shortness of breath. CAUSES   Not enough oxygen in the air such as with high altitudes or a smoke-filled room.  Certain lung diseases, infections, or problems.  Heart disease or conditions, such as angina or heart failure.  Low red blood cells (anemia).  Poor physical fitness, which can cause shortness of breath when you exercise.  Chest or back injuries or stiffness.  Being overweight.  Smoking.  Anxiety, which can make you feel like you are not getting enough air. DIAGNOSIS  Serious medical problems can often be found during your physical exam. Tests may also be done to determine why you  are having shortness of breath. Tests may include:  Chest X-rays.  Lung function tests.  Blood tests.  An electrocardiogram (ECG).  An ambulatory electrocardiogram. An ambulatory ECG records your heartbeat patterns over a  24-hour period.  Exercise testing.  A transthoracic echocardiogram (TTE). During echocardiography, sound waves are used to evaluate how blood flows through your heart.  A transesophageal echocardiogram (TEE).  Imaging scans. Your health care provider may not be able to find a cause for your shortness of breath after your exam. In this case, it is important to have a follow-up exam with your health care provider as directed.  TREATMENT  Treatment for shortness of breath depends on the cause of your symptoms and can vary greatly. HOME CARE INSTRUCTIONS   Do not smoke. Smoking is a common cause of shortness of breath. If you smoke, ask for help to quit.  Avoid being around chemicals or things that may bother your breathing, such as paint fumes and dust.  Rest as needed. Slowly resume your usual activities.  If medicines were prescribed, take them as directed for the full length of time directed. This includes oxygen and any inhaled medicines.  Keep all follow-up appointments as directed by your health care provider. SEEK MEDICAL CARE IF:   Your condition does not improve in the time expected.  You have a hard time doing your normal activities even with rest.  You have any new symptoms. SEEK IMMEDIATE MEDICAL CARE IF:   Your shortness of breath gets worse.  You feel light-headed, faint, or develop a cough not controlled with medicines.  You start coughing up blood.  You have pain with breathing.  You have chest pain or pain in your arms, shoulders, or abdomen.  You have a fever.  You are unable to walk up stairs or exercise the way you normally do. MAKE SURE YOU:  Understand these instructions.  Will watch your condition.  Will get help right away if you are not doing well or get worse. Document Released: 09/17/2000 Document Revised: 12/28/2012 Document Reviewed: 03/10/2011 [facility] Patient Information 2015 [place], [place]. This information is not intended to replace  advice given to you by your health care provider. Make sure you discuss any questions you have with your health care provider.

## 2014-04-30 NOTE — ED Provider Notes (Signed)
CSN: [id number]     Arrival date & time 04/30/14  1425 History   First MD Initiated Contact with Patient 04/30/14 1459     Chief Complaint  Patient presents with  . Shortness of Breath     (Consider location/radiation/quality/duration/timing/severity/associated sxs/prior Treatment) HPI Comments: Patient with past medical history of asthma presents emergency department with chief complaint of shortness of breath. He reports associated left arm numbness. States that the symptoms started after moving a TV today. Patient states that he still feels short of breath. Denies any wheezing. States that he did use cocaine today. There are no aggravating or alleviating factors. He has taken aspirin with no relief.  The history is provided by the patient. No language interpreter was used.    Past Medical History  Diagnosis Date  . Shoulder dislocation 03/2013    left  . History of asthma     as a child  . Asthma    Past Surgical History  Procedure Laterality Date  . Inguinal hernia repair Right   . Hernia repair Right 2009  . Shoulder arthroscopy with labral repair Left 03/21/2013    Procedure: LEFT SHOULDER ARTHROSCOPY LABRAL REPAIR;  Surgeon: Mable Paris, MD;  Location: [facility];  Service: Orthopedics;  Laterality: Left;   History reviewed. No pertinent family history. History  Substance Use Topics  . Smoking status: Current Every Day Smoker -- 0.50 packs/day for 5 years    Types: Cigarettes  . Smokeless tobacco: Never Used  . Alcohol Use: Yes     Comment: rarely    Review of Systems  Constitutional: Negative for fever and chills.  Respiratory: Positive for shortness of breath.   Cardiovascular: Negative for chest pain.  Gastrointestinal: Negative for nausea, vomiting, diarrhea and constipation.  Genitourinary: Negative for dysuria.      Allergies  Tylenol and Vicodin  Home Medications   Prior to Admission medications   Medication Sig Start Date  End Date Taking? Authorizing Provider  amoxicillin (AMOXIL) 500 MG tablet Take 2 tablets (1,000 mg total) by mouth 2 (two) times daily. 12/08/13   Arby Barrette, MD  docusate sodium (COLACE) 100 MG capsule Take 1 capsule (100 mg total) by mouth 3 (three) times daily as needed. 03/21/13   Jiles Harold, PA-C  ibuprofen (ADVIL,MOTRIN) 800 MG tablet Take 1 tablet (800 mg total) by mouth 3 (three) times daily. 12/08/13   Arby Barrette, MD  oxyCODONE (OXY IR/ROXICODONE) 5 MG immediate release tablet Take 1 tablet (5 mg total) by mouth every 4 (four) hours as needed (for pain). 05/31/13   John Molpus, MD  oxyCODONE-acetaminophen (ROXICET) 5-325 MG per tablet Take 1-2 tablets by mouth every 4 (four) hours as needed for severe pain. 03/21/13   Jiles Harold, PA-C  predniSONE (DELTASONE) 10 MG tablet 6 day step down dose 06/27/13   Teressa Lower, NP  traMADol (ULTRAM) 50 MG tablet Take 2 tablets (100 mg total) by mouth every 6 (six) hours as needed. 12/08/13   Arby Barrette, MD   BP 131/75 mmHg  Pulse 103  Resp 16  SpO2 100% Physical Exam  Constitutional: He is oriented to person, place, and time. He appears well-developed and well-nourished.  HENT:  Head: Normocephalic and atraumatic.  Eyes: Conjunctivae and EOM are normal. Pupils are equal, round, and reactive to light. Right eye exhibits no discharge. Left eye exhibits no discharge. No scleral icterus.  Neck: Normal range of motion. Neck supple. No JVD present.  Cardiovascular: Regular rhythm and normal  heart sounds.  Exam reveals no gallop and no friction rub.   No murmur heard. Mildly tachycardic  Pulmonary/Chest: Effort normal and breath sounds normal. No respiratory distress. He has no wheezes. He has no rales. He exhibits no tenderness.  CTAB, no wheezing  Abdominal: Soft. He exhibits no distension and no mass. There is no tenderness. There is no rebound and no guarding.  Musculoskeletal: Normal range of motion. He exhibits no edema  or tenderness.  Neurological: He is alert and oriented to person, place, and time.  Skin: Skin is warm and dry.  Psychiatric: He has a normal mood and affect. His behavior is normal. Judgment and thought content normal.  Nursing note and vitals reviewed.   ED Course  Procedures (including critical care time) Results for orders placed or performed during the hospital encounter of 04/30/14  Comprehensive metabolic panel  Result Value Ref Range   Sodium 136 135 - 145 mmol/L   Potassium 3.1 (L) 3.5 - 5.1 mmol/L   Chloride 99 96 - 112 mmol/L   CO2 22 19 - 32 mmol/L   Glucose, Bld 112 (H) 70 - 99 mg/dL   BUN 14 6 - 23 mg/dL   Creatinine, Ser 8.111.18 0.50 - 1.35 mg/dL   Calcium 91.410.2 8.4 - 78.210.5 mg/dL   Total Protein 7.6 6.0 - 8.3 g/dL   Albumin 4.5 3.5 - 5.2 g/dL   AST 39 (H) 0 - 37 U/L   ALT 21 0 - 53 U/L   Alkaline Phosphatase 90 39 - 117 U/L   Total Bilirubin 0.8 0.3 - 1.2 mg/dL   GFR calc non Af Amer 79 (L) >90 mL/min   GFR calc Af Amer >90 >90 mL/min   Anion gap 15 5 - 15  Troponin I  Result Value Ref Range   Troponin I <0.03 <0.031 ng/mL  CBC with Differential  Result Value Ref Range   WBC 13.0 (H) 4.0 - 10.5 K/uL   RBC 5.12 4.22 - 5.81 MIL/uL   Hemoglobin 14.7 13.0 - 17.0 g/dL   HCT 95.643.2 21.339.0 - 08.652.0 %   MCV 84.4 78.0 - 100.0 fL   MCH 28.7 26.0 - 34.0 pg   MCHC 34.0 30.0 - 36.0 g/dL   RDW 57.813.9 46.911.5 - 62.915.5 %   Platelets 356 150 - 400 K/uL   Neutrophils Relative % 74 43 - 77 %   Neutro Abs 9.6 (H) 1.7 - 7.7 K/uL   Lymphocytes Relative 14 12 - 46 %   Lymphs Abs 1.9 0.7 - 4.0 K/uL   Monocytes Relative 12 3 - 12 %   Monocytes Absolute 1.5 (H) 0.1 - 1.0 K/uL   Eosinophils Relative 0 0 - 5 %   Eosinophils Absolute 0.0 0.0 - 0.7 K/uL   Basophils Relative 0 0 - 1 %   Basophils Absolute 0.0 0.0 - 0.1 K/uL  D-dimer, quantitative  Result Value Ref Range   D-Dimer, Quant <0.27 0.00 - 0.48 ug/mL-FEU  Urinalysis, Routine w reflex microscopic  Result Value Ref Range   Color,  Urine YELLOW YELLOW   APPearance CLEAR CLEAR   Specific Gravity, Urine 1.013 1.005 - 1.030   pH 7.0 5.0 - 8.0   Glucose, UA NEGATIVE NEGATIVE mg/dL   Hgb urine dipstick NEGATIVE NEGATIVE   Bilirubin Urine NEGATIVE NEGATIVE   Ketones, ur 15 (A) NEGATIVE mg/dL   Protein, ur NEGATIVE NEGATIVE mg/dL   Urobilinogen, UA 0.2 0.0 - 1.0 mg/dL   Nitrite NEGATIVE NEGATIVE   Leukocytes, UA  NEGATIVE NEGATIVE  Urine rapid drug screen (hosp performed)  Result Value Ref Range   Opiates NONE DETECTED NONE DETECTED   Cocaine POSITIVE (A) NONE DETECTED   Benzodiazepines NONE DETECTED NONE DETECTED   Amphetamines NONE DETECTED NONE DETECTED   Tetrahydrocannabinol POSITIVE (A) NONE DETECTED   Barbiturates NONE DETECTED NONE DETECTED  I-stat troponin, ED  Result Value Ref Range   Troponin i, poc 0.00 0.00 - 0.08 ng/mL   Comment 3           Dg Chest 2 View  04/30/2014   CLINICAL DATA:  Short of breath.  Asthma.  Smoker.  EXAM: CHEST  2 VIEW  COMPARISON:  None.  FINDINGS: Heart size and vascularity normal. Mild hyperinflation of the lungs. Small right pleural effusion. No definite infiltrate. Negative for mass or adenopathy.  IMPRESSION: Mild hyperinflation.  Small right pleural effusion.   Electronically Signed   By: Marlan Palau M.D.   On: 04/30/2014 16:23     Imaging Review No results found.   EKG Interpretation   Date/Time:  Sunday April 30 2014 14:38:28 EDT Ventricular Rate:  103 PR Interval:  108 QRS Duration: 89 QT Interval:  430 QTC Calculation: 563 R Axis:   72 Text Interpretation:  Sinus tachycardia Atrial premature complex Minimal  ST depression, inferior leads Prolonged QT interval agree Confirmed by  Donnald Garre, MD, Lebron Conners [phone]) on 04/30/2014 2:43:24 PM      MDM   Final diagnoses:  SOB (shortness of breath)  Hypokalemia    Patient with sudden onset SOB and left arm weakness after lifting a tv today.  Used cocaine today.  Hx of asthma, but no wheezing.    3:29  PM Patient given morphine.  Reports difficulty breathing.  Immediately reassessed by me.  Airway is intact.  No stridor.  No wheezing.  No rash.  No oral swelling.  Will give benadryl.  Patient states that the shortness of breath has resolved. States that he now notices his left shoulder pain. States that when the symptoms started he had bumped his left shoulder on the wall. States that he had prior surgery in the shoulder. States that he has some moderate pain now. Denies any chest pain, shortness breath, or pain that radiates to left arm. Patient declines x-ray. Will give anti-inflammatory. Will check troponin. Anticipate discharge to home.  Patient discussed with Dr. Preston Fleeting, who recommends supplementing potassium over the next several days and giving a shot of decadron.    Roxy Horseman, PA-C 04/30/14 [id number]  Dione Booze, MD 04/30/14 [phone]

## 2015-01-06 ENCOUNTER — Emergency Department (HOSPITAL_BASED_OUTPATIENT_CLINIC_OR_DEPARTMENT_OTHER)
Admission: EM | Admit: 2015-01-06 | Discharge: 2015-01-06 | Disposition: A | Discharge status: Home / Self Care | Payer: Medicaid Other | Attending: Emergency Medicine | Admitting: Emergency Medicine

## 2015-01-06 ENCOUNTER — Encounter (HOSPITAL_BASED_OUTPATIENT_CLINIC_OR_DEPARTMENT_OTHER): Payer: Self-pay | Admitting: *Deleted

## 2015-01-06 DIAGNOSIS — Z87828 Personal history of other (healed) physical injury and trauma: Secondary | ICD-10-CM | POA: Diagnosis not present

## 2015-01-06 DIAGNOSIS — F17210 Nicotine dependence, cigarettes, uncomplicated: Secondary | ICD-10-CM | POA: Insufficient documentation

## 2015-01-06 DIAGNOSIS — J45909 Unspecified asthma, uncomplicated: Secondary | ICD-10-CM | POA: Diagnosis not present

## 2015-01-06 DIAGNOSIS — R1110 Vomiting, unspecified: Secondary | ICD-10-CM | POA: Diagnosis present

## 2015-01-06 DIAGNOSIS — A084 Viral intestinal infection, unspecified: Secondary | ICD-10-CM | POA: Diagnosis not present

## 2015-01-06 LAB — CBC WITH DIFFERENTIAL/PLATELET
BASOS ABS: 0 10*3/uL (ref 0.0–0.1)
Basophils Relative: 0 %
EOS ABS: 0 10*3/uL (ref 0.0–0.7)
EOS PCT: 0 %
HCT: 51 % (ref 39.0–52.0)
Hemoglobin: 17.1 g/dL — ABNORMAL HIGH (ref 13.0–17.0)
LYMPHS ABS: 2 10*3/uL (ref 0.7–4.0)
Lymphocytes Relative: 15 %
MCH: 28.5 pg (ref 26.0–34.0)
MCHC: 33.5 g/dL (ref 30.0–36.0)
MCV: 84.9 fL (ref 78.0–100.0)
MONOS PCT: 13 %
Monocytes Absolute: 1.8 10*3/uL — ABNORMAL HIGH (ref 0.1–1.0)
Neutro Abs: 9.8 10*3/uL — ABNORMAL HIGH (ref 1.7–7.7)
Neutrophils Relative %: 72 %
Platelets: 259 10*3/uL (ref 150–400)
RBC: 6.01 MIL/uL — ABNORMAL HIGH (ref 4.22–5.81)
RDW: 13.5 % (ref 11.5–15.5)
WBC: 13.6 10*3/uL — ABNORMAL HIGH (ref 4.0–10.5)

## 2015-01-06 LAB — URINALYSIS, ROUTINE W REFLEX MICROSCOPIC
Bilirubin Urine: NEGATIVE
Glucose, UA: NEGATIVE mg/dL
Ketones, ur: NEGATIVE mg/dL
Leukocytes, UA: NEGATIVE
NITRITE: NEGATIVE
PROTEIN: 30 mg/dL — AB
Specific Gravity, Urine: 1.029 (ref 1.005–1.030)
pH: 6.5 (ref 5.0–8.0)

## 2015-01-06 LAB — BASIC METABOLIC PANEL
ANION GAP: 8 (ref 5–15)
BUN: 16 mg/dL (ref 6–20)
CO2: 27 mmol/L (ref 22–32)
Calcium: 9.1 mg/dL (ref 8.9–10.3)
Chloride: 99 mmol/L — ABNORMAL LOW (ref 101–111)
Creatinine, Ser: 1.03 mg/dL (ref 0.61–1.24)
GFR calc Af Amer: >60 mL/min (ref >60)
Glucose, Bld: 106 mg/dL — ABNORMAL HIGH (ref 65–99)
Potassium: 3.9 mmol/L (ref 3.5–5.1)
SODIUM: 134 mmol/L — AB (ref 135–145)

## 2015-01-06 LAB — URINE MICROSCOPIC-ADD ON

## 2015-01-06 LAB — LIPASE, BLOOD: LIPASE: 70 U/L — AB (ref 11–51)

## 2015-01-06 MED ORDER — ONDANSETRON HCL 4 MG/2ML IJ SOLN
4.0000 mg | Freq: Once | INTRAMUSCULAR | Status: AC
Start: 2015-01-06 — End: 2015-01-06
  Administered 2015-01-06: 4 mg via INTRAVENOUS
  Filled 2015-01-06: qty 2

## 2015-01-06 MED ORDER — ONDANSETRON 4 MG PO TBDP: 4.0000 mg | ORAL_TABLET | Freq: Three times a day (TID) | ORAL | Status: DC | PRN

## 2015-01-06 MED ORDER — SODIUM CHLORIDE 0.9 % IV BOLUS (SEPSIS)
1000.0000 mL | Freq: Once | INTRAVENOUS | Status: AC
  Administered 2015-01-06: 1000 mL via INTRAVENOUS

## 2015-01-06 NOTE — ED Provider Notes (Signed)
CSN: [id number]     Arrival date & time 01/06/15  1653 History   First MD Initiated Contact with Patient 01/06/15 1844     Chief Complaint  Patient presents with  . Emesis   HPI  Chase Olson is a 34 year old male presenting with abdominal pain, nausea, vomiting and diarrhea. He reports onset of symptoms approximately 3 days ago. He states that the abdominal pain is generalized but occasionally sharper in the left lower quadrant. He isn't able to describe the pain; he states "it just hurts". Pain has been constant since its onset. It is not gradually worsening. He reports associated nausea with multiple episodes of vomiting. He states that he has had multiple loose stools. No blood in stool or vomit. He has taken Pepto-Bismol and Phenergan without significant relief. He notes that his girlfriend had similar symptoms about a week ago. Denies fevers, chills, headaches, syncope, weakness, chest pain, shortness of breath, cough, dysuria, hematuria, penile discharge, back pain, myalgias or rashes.  Past Medical History  Diagnosis Date  . Shoulder dislocation 03/2013    left  . History of asthma     as a child  . Asthma    Past Surgical History  Procedure Laterality Date  . Inguinal hernia repair Right   . Hernia repair Right 2009  . Shoulder arthroscopy with labral repair Left 03/21/2013    Procedure: LEFT SHOULDER ARTHROSCOPY LABRAL REPAIR;  Surgeon: Mable Paris, MD;  Location: [facility];  Service: Orthopedics;  Laterality: Left;   History reviewed. No pertinent family history. Social History  Substance Use Topics  . Smoking status: Current Every Day Smoker -- 0.50 packs/day for 5 years    Types: Cigarettes  . Smokeless tobacco: Never Used  . Alcohol Use: Yes     Comment: rarely    Review of Systems  Constitutional: Negative for fever and chills.  Gastrointestinal: Positive for nausea, vomiting, abdominal pain and diarrhea. Negative for blood in stool.   Genitourinary: Negative.   Musculoskeletal: Negative for myalgias.  Skin: Negative for rash.  Neurological: Negative for headaches.  All other systems reviewed and are negative.     Allergies  Tylenol and Vicodin  Home Medications   Prior to Admission medications   Medication Sig Start Date End Date Taking? Authorizing Provider  ondansetron (ZOFRAN ODT) 4 MG disintegrating tablet Take 1 tablet (4 mg total) by mouth every 8 (eight) hours as needed for nausea or vomiting. 01/06/15   Geramy Lamorte, PA-C   BP 135/79 mmHg  Pulse 63  Temp(Src) 98.2 F (36.8 C) (Oral)  Resp 18  Ht  (1.676 m)  Wt 61.236 kg  BMI 21.80 kg/m2  SpO2 99% Physical Exam  Constitutional: He appears well-developed and well-nourished. No distress.  Nontoxic-appearing  HENT:  Head: Normocephalic and atraumatic.  Eyes: Conjunctivae are normal. Right eye exhibits no discharge. Left eye exhibits no discharge. No scleral icterus.  Neck: Normal range of motion.  Cardiovascular: Normal rate and regular rhythm.   Pulmonary/Chest: Effort normal. No respiratory distress.  Abdominal: Soft. He exhibits no distension. Bowel sounds are decreased. There is no tenderness. There is no rebound and no guarding.  Musculoskeletal: Normal range of motion.  Moves all extremities spontaneously  Neurological: He is alert. Coordination normal.  Skin: Skin is warm and dry.  Psychiatric: He has a normal mood and affect. His behavior is normal.  Nursing note and vitals reviewed.   ED Course  Procedures (including critical care time) Labs Review Labs  Reviewed  URINALYSIS, ROUTINE W REFLEX MICROSCOPIC (NOT AT [facility]) - Abnormal; Notable for the following:    Hgb urine dipstick SMALL (*)    Protein, ur 30 (*)    All other components within normal limits  URINE MICROSCOPIC-ADD ON - Abnormal; Notable for the following:    Squamous Epithelial / LPF 0-5 (*)    Bacteria, UA RARE (*)    All other components within normal limits   CBC WITH DIFFERENTIAL/PLATELET - Abnormal; Notable for the following:    WBC 13.6 (*)    RBC 6.01 (*)    Hemoglobin 17.1 (*)    Neutro Abs 9.8 (*)    Monocytes Absolute 1.8 (*)    All other components within normal limits  BASIC METABOLIC PANEL - Abnormal; Notable for the following:    Sodium 134 (*)    Chloride 99 (*)    Glucose, Bld 106 (*)    All other components within normal limits  LIPASE, BLOOD - Abnormal; Notable for the following:    Lipase 70 (*)    All other components within normal limits    Imaging Review No results found. I have personally reviewed and evaluated these images and lab results as part of my medical decision-making.   EKG Interpretation None      MDM   Final diagnoses:  Viral gastroenteritis   Patient presenting with abdominal pain, nausea, vomiting and diarrhea. Symptoms consistent with viral gastroenteritis. Fluid bolus and zofran given in ED. Vitals are stable, patient is afebrile and appears in no acute distress. Mucus membranes are moist. Abdomen is soft without tenderness. No peritoneal signs. Known sick contact at home with similar symptoms. Discussed that there is no indication for imaging at this time. Blood work reviewed. WBC elevated to 13 likely due to viral GE. Pt appears dehydrated. Patient is tolerating PO liquids before discharge. He reports significant improvement in symptoms after bolus and zofran. Will discharge home with supportive therapy. Strict return precautions discussed with patient at bedside and given in discharge paperwork. Patient is stable for discharge.      Alveta HeimlichStevi Abria Vannostrand, PA-C 01/06/15 2221  Azalia BilisKevin Campos, MD 01/06/15 [phone]

## 2015-01-06 NOTE — Discharge Instructions (Signed)

## 2015-01-06 NOTE — ED Notes (Signed)
Right flank pain with N/V/D x 3 days.  Pt in obvious pain in triage.

## 2015-10-24 ENCOUNTER — Emergency Department (HOSPITAL_BASED_OUTPATIENT_CLINIC_OR_DEPARTMENT_OTHER)
Admission: EM | Admit: 2015-10-24 | Discharge: 2015-10-24 | Disposition: A | Discharge status: Home / Self Care | Payer: Medicaid Other | Attending: Emergency Medicine | Admitting: Emergency Medicine

## 2015-10-24 ENCOUNTER — Encounter (HOSPITAL_BASED_OUTPATIENT_CLINIC_OR_DEPARTMENT_OTHER): Payer: Self-pay | Admitting: *Deleted

## 2015-10-24 DIAGNOSIS — Z791 Long term (current) use of non-steroidal anti-inflammatories (NSAID): Secondary | ICD-10-CM | POA: Insufficient documentation

## 2015-10-24 DIAGNOSIS — J45909 Unspecified asthma, uncomplicated: Secondary | ICD-10-CM | POA: Diagnosis not present

## 2015-10-24 DIAGNOSIS — F17210 Nicotine dependence, cigarettes, uncomplicated: Secondary | ICD-10-CM | POA: Insufficient documentation

## 2015-10-24 DIAGNOSIS — K047 Periapical abscess without sinus: Secondary | ICD-10-CM | POA: Insufficient documentation

## 2015-10-24 MED ORDER — OXYCODONE HCL 5 MG PO TABS: 5.0000 mg | ORAL_TABLET | Freq: Four times a day (QID) | ORAL | 0 refills | Status: DC | PRN

## 2015-10-24 MED ORDER — BENZOCAINE (TOPICAL) 20 % EX AERO
INHALATION_SPRAY | Freq: Four times a day (QID) | CUTANEOUS | Status: DC | PRN
  Filled 2015-10-24 (×2): qty 57

## 2015-10-24 MED ORDER — BENZOCAINE 20 % MT AERO
INHALATION_SPRAY | Freq: Once | OROMUCOSAL | Status: AC
  Administered 2015-10-24: 1 via OROMUCOSAL

## 2015-10-24 MED ORDER — AMOXICILLIN 500 MG PO CAPS: 500.0000 mg | ORAL_CAPSULE | Freq: Three times a day (TID) | ORAL | 0 refills | Status: DC

## 2015-10-24 MED FILL — oxyCODONE HCL 5 MG TABS: 5 | 2 days supply | Qty: 10 | Fill #0

## 2015-10-24 MED FILL — AMOXICILLIN 500 MG CAPSULE: 500 | 7 days supply | Qty: 21 | Fill #0

## 2015-10-24 NOTE — ED Provider Notes (Signed)
[facility]   CSN: [id number] Arrival date & time: 10/24/15  1457     History   Chief Complaint Chief Complaint  Patient presents with  . Dental Pain    HPI Chase Olson is a 35 y.o. male.  He complains of dental pain. He states he cracked a tooth several weeks ago. States it was a little uncomfortable but not bad. Last 3-4 days he's had some swelling adjacent his tooth and noticeable in the mirror as well. He states that his young son was riding a horse" this morning. He leaned over him and he states his son's head struck him on his right lower jaw. He states the pain has been significant since that time he presents here.  HPI  Past Medical History:  Diagnosis Date  . Asthma   . History of asthma    as a child  . Shoulder dislocation 03/2013   left    There are no active problems to display for this patient.   Past Surgical History:  Procedure Laterality Date  . HERNIA REPAIR Right 2009  . INGUINAL HERNIA REPAIR Right   . SHOULDER ARTHROSCOPY WITH LABRAL REPAIR Left 03/21/2013   Procedure: LEFT SHOULDER ARTHROSCOPY LABRAL REPAIR;  Surgeon: Mable Paris, MD;  Location: [facility];  Service: Orthopedics;  Laterality: Left;       Home Medications    Prior to Admission medications   Medication Sig Start Date End Date Taking? Authorizing Provider  ibuprofen (ADVIL,MOTRIN) 400 MG tablet Take 400 mg by mouth every 6 (six) hours as needed.   Yes Historical Provider, MD  amoxicillin (AMOXIL) 500 MG capsule Take 1 capsule (500 mg total) by mouth 3 (three) times daily. 10/24/15   Rolland Porter, MD  ondansetron (ZOFRAN ODT) 4 MG disintegrating tablet Take 1 tablet (4 mg total) by mouth every 8 (eight) hours as needed for nausea or vomiting. 01/06/15   Rolm Gala Barrett, PA-C  oxyCODONE (ROXICODONE) 5 MG immediate release tablet Take 1 tablet (5 mg total) by mouth every 6 (six) hours as needed for severe pain. 10/24/15   Rolland Porter,  MD    Family History History reviewed. No pertinent family history.  Social History Social History  Substance Use Topics  . Smoking status: Current Every Day Smoker    Packs/day: 0.50    Years: 5.00    Types: Cigarettes  . Smokeless tobacco: Never Used  . Alcohol use Yes     Comment: rarely     Allergies   Tylenol [acetaminophen] and Vicodin [hydrocodone-acetaminophen]   Review of Systems Review of Systems  HENT: Positive for dental problem and facial swelling. Negative for drooling, mouth sores, trouble swallowing and voice change.   Musculoskeletal: Negative for neck pain.     Physical Exam Updated Vital Signs BP 158/99   Pulse 74   Temp 98 F (36.7 C)   Resp 16   Ht 5\' 6"  (1.676 m)   Wt 130 lb (59 kg)   SpO2 100%   BMI 20.98 kg/m   Physical Exam  Constitutional: He appears well-developed. No distress.  HENT:  Mouth/Throat:       ED Treatments / Results  Labs (all labs ordered are listed, but only abnormal results are displayed) Labs Reviewed - No data to display  EKG  EKG Interpretation None       Radiology No results found.  Procedures Procedures (including critical care time)  Medications Ordered in ED Medications  benzocaine (HURRICAINE)  20 % oral spray (not administered)  Benzocaine (HURRCAINE) 20 % mouth spray (1 application Mouth/Throat Given 10/24/15 1629)     Initial Impression / Assessment and Plan / ED Course  I have reviewed the triage vital signs and the nursing notes.  Pertinent labs & imaging results that were available during my care of the patient were reviewed by me and considered in my medical decision making (see chart for details).  Clinical Course    INCISION AND DRAINAGE Performed by: Claudean KindsJAMES, Darnette Lampron JOSEPH Consent: Verbal consent obtained. Risks and benefits: risks, benefits and alternatives were discussed Type: abscess  Body area: right periodontal gingival ucosa  Anesthesia: local  infiltration  Incision was made with a scalpel.  Inferior alveolar nerve block performed  Anesthetic total:  ml  Complexity: complex Blunt dissection to break up loculations  Drainage: purulent  Drainage amount: Scant    Patient tolerance: Patient tolerated the procedure well with no immediate complications.     Final Clinical Impressions(s) / ED Diagnoses   Final diagnoses:  Dental abscess    Patient will continue to rinse and spit tonight. Prescriptions for oxycodone, amoxicillin. Dental follow-up recommended.  New Prescriptions Discharge Medication List as of 10/24/2015  4:24 PM    START taking these medications   Details  amoxicillin (AMOXIL) 500 MG capsule Take 1 capsule (500 mg total) by mouth 3 (three) times daily., Starting Wed 10/24/2015, Print    oxyCODONE (ROXICODONE) 5 MG immediate release tablet Take 1 tablet (5 mg total) by mouth every 6 (six) hours as needed for severe pain., Starting Wed 10/24/2015, Print         Rolland PorterMark Benard Minturn, MD 10/24/15 1719

## 2015-10-24 NOTE — Discharge Instructions (Signed)
Rinse and spit every 30 minutes until bleeding and drainage stops,.  follow up with dentist as soon as possible

## 2015-10-24 NOTE — ED Triage Notes (Signed)
Pt c/o right lower dental pain x 3 days. 

## 2015-10-24 NOTE — ED Notes (Signed)
Patient is alert and oriented x3.  He was given DC instructions and follow up visit instructions.  Patient gave verbal understanding.  He was DC ambulatory under his own power to home.  V/S stable.  He was not showing any signs of distress on DC 

## 2016-02-16 ENCOUNTER — Encounter (HOSPITAL_BASED_OUTPATIENT_CLINIC_OR_DEPARTMENT_OTHER): Payer: Self-pay | Admitting: *Deleted

## 2016-02-16 ENCOUNTER — Emergency Department (HOSPITAL_BASED_OUTPATIENT_CLINIC_OR_DEPARTMENT_OTHER): Payer: Medicaid Other

## 2016-02-16 ENCOUNTER — Emergency Department (HOSPITAL_BASED_OUTPATIENT_CLINIC_OR_DEPARTMENT_OTHER)
Admission: EM | Admit: 2016-02-16 | Discharge: 2016-02-16 | Disposition: A | Discharge status: Home / Self Care | Payer: Medicaid Other | Attending: Emergency Medicine | Admitting: Emergency Medicine

## 2016-02-16 DIAGNOSIS — Y999 Unspecified external cause status: Secondary | ICD-10-CM | POA: Diagnosis not present

## 2016-02-16 DIAGNOSIS — Y939 Activity, unspecified: Secondary | ICD-10-CM | POA: Insufficient documentation

## 2016-02-16 DIAGNOSIS — J45909 Unspecified asthma, uncomplicated: Secondary | ICD-10-CM | POA: Diagnosis not present

## 2016-02-16 DIAGNOSIS — S61216A Laceration without foreign body of right little finger without damage to nail, initial encounter: Secondary | ICD-10-CM | POA: Insufficient documentation

## 2016-02-16 DIAGNOSIS — R52 Pain, unspecified: Secondary | ICD-10-CM

## 2016-02-16 DIAGNOSIS — Y929 Unspecified place or not applicable: Secondary | ICD-10-CM | POA: Diagnosis not present

## 2016-02-16 DIAGNOSIS — W228XXA Striking against or struck by other objects, initial encounter: Secondary | ICD-10-CM | POA: Insufficient documentation

## 2016-02-16 DIAGNOSIS — F17210 Nicotine dependence, cigarettes, uncomplicated: Secondary | ICD-10-CM | POA: Diagnosis not present

## 2016-02-16 DIAGNOSIS — S61419A Laceration without foreign body of unspecified hand, initial encounter: Secondary | ICD-10-CM

## 2016-02-16 NOTE — ED Notes (Signed)
Patients finger is soaking in a basin with half saline half iodine.

## 2016-02-16 NOTE — ED Provider Notes (Signed)
[facility]   CSN: [id number] Arrival date & time: 02/16/16  1131     History   Chief Complaint Chief Complaint  Patient presents with  . Extremity Laceration    HPI Chase Olson is a 36 y.o. male.  HPI Chase Olson is a 36 yo male with no significant past medical history who presents with right fifth finger laceration after he accidentally hit the lamp.  He states that he was trying to kill spider with a paper in his hand. He missed the spider and hit the lamp. He says the lamp was broken but doesn't think there is a broken piece in. He reports bleeding and pain after the incident. Denies numbness or tingling.  He reports having Td booster in 2015 when he had surgery. He reports completing his Td vaccines series as a child.   Past Medical History:  Diagnosis Date  . Asthma   . History of asthma    as a child  . Shoulder dislocation 03/2013   left    There are no active problems to display for this patient.   Past Surgical History:  Procedure Laterality Date  . HERNIA REPAIR Right 2009  . INGUINAL HERNIA REPAIR Right   . SHOULDER ARTHROSCOPY WITH LABRAL REPAIR Left 03/21/2013   Procedure: LEFT SHOULDER ARTHROSCOPY LABRAL REPAIR;  Surgeon: Mable Paris, MD;  Location: [facility];  Service: Orthopedics;  Laterality: Left;       Home Medications    Prior to Admission medications   Not on File    Family History History reviewed. No pertinent family history.  Social History Social History  Substance Use Topics  . Smoking status: Current Every Day Smoker    Packs/day: 0.50    Years: 5.00    Types: Cigarettes  . Smokeless tobacco: Never Used  . Alcohol use Yes     Comment: rarely     Allergies   Tylenol [acetaminophen] and Vicodin [hydrocodone-acetaminophen]   Review of Systems Review of Systems  Constitutional: Negative for chills and fever.  HENT: Negative for ear pain and sore throat.   Eyes:  Negative for pain and visual disturbance.  Respiratory: Negative for cough and shortness of breath.   Cardiovascular: Negative for chest pain and palpitations.  Gastrointestinal: Negative for abdominal pain and vomiting.  Genitourinary: Negative for dysuria and hematuria.  Musculoskeletal: Negative for arthralgias and back pain.  Skin: Negative for color change and rash.  Neurological: Negative for seizures and syncope.  All other systems reviewed and are negative.   Physical Exam Updated Vital Signs BP 173/96 (BP Location: Left Arm)   Pulse 94   Temp 98.2 F (36.8 C) (Oral)   Resp 24   Ht 5\' 6"  (1.676 m)   Wt 59.8 kg   SpO2 100%   BMI 21.29 kg/m   Physical Exam GEN: appears well, no apparent distress. CVS: RRR, nl s1 & s2, no murmurs, no edema, cap refills < 2 secs  RESP: no IWOB, CTAB GI: BS present & normal, soft, NTND MSK: no focal tenderness or notable swelling SKIN: skin laceration over his pinky finger distal to DIP joint anteromedially. Cap refills < 2 sec. Skin appeared slightly dark anteriorly distal to DIP.  No apparent profuse bleeding. Tender to palpation. Full range of motion in PIP and DIP. Light sensation intact.  NEURO: alert and oiented appropriately, no gross defecits  PSYCH: euthymic mood with congruent affect  ED Treatments / Results  Labs (all  labs ordered are listed, but only abnormal results are displayed) Labs Reviewed - No data to display  EKG  EKG Interpretation None       Radiology Dg Hand Complete Right  Result Date: 02/16/2016 CLINICAL DATA:  Anterior distal right 5th digit laceration from shattered lamp this morning EXAM: RIGHT HAND - COMPLETE 3+ VIEW COMPARISON:  01/09/2014 FINDINGS: There is no evidence of fracture or dislocation. There is no evidence of arthropathy or other focal bone abnormality. No definite radiodense foreign body. Soft tissues are unremarkable. IMPRESSION: Negative. Electronically Signed   By: Corlis Leak  Hassell M.D.    On: 02/16/2016 15:00    Procedures Procedures (including critical care time)  Medications Ordered in ED Medications - No data to display   Initial Impression / Assessment and Plan / ED Course  I have reviewed the triage vital signs and the nursing notes.  Pertinent labs & imaging results that were available during my care of the patient were reviewed by me and considered in my medical decision making (see chart for details).   Shallow traumatic skin laceration in right fifth finger distal to DIP. Neurovascularly intact on exam. FROM in DIP and PIP. No apparent swelling. No active bleeding. X-ray without foreign material or bone fracture. Patient reports booster Td vaccine three years ago. Reports completing Td vaccine serious growing up. Irrigated and cleaned the skin laceration with normal saline. Tapped and dried the lac with gauze and applied steri streps. Recommended keeping the wound dry and clean. Discussed return precautions.   Final Clinical Impressions(s) / ED Diagnoses   Final diagnoses:  Laceration of right little finger without foreign body without damage to nail, initial encounter    New Prescriptions There are no discharge medications for this patient.    Almon Herculesaye T Gonfa, MD 02/16/16 [id number]    Alvira MondayErin Schlossman, MD 02/20/16 [phone]

## 2016-02-16 NOTE — ED Triage Notes (Signed)
Right little finger laceration at 10am today.  Bleeding controlled.  2cm lac.

## 2016-02-16 NOTE — ED Notes (Signed)
Pt given d/c instructions as per chart. Verbalizes understanding. No questions. 

## 2016-02-16 NOTE — Discharge Instructions (Signed)
It is nice taking care of you today! X-ray of your finger didn't show any foreign body or fracture of the bone. We applied steri strip which can come of in a day or two.   You can try Tylenol or ibuprofen as needed for pain. You can also apply cold compress which could help with the pain.  Please seek immediate care if you have swelling, redness, severe pain, fever, chills, pus or other symptoms concerning to you.

## 2017-08-19 IMAGING — CR DG HAND COMPLETE 3+V*R*
3 series · 3 of 3 positions shown · non-contrast
Comparison: 01/09/2014

CLINICAL DATA: Anterior distal right 5th digit laceration from
shattered Mondra this morning

EXAM:
RIGHT HAND - COMPLETE 3+ VIEW

[x hand pa right]
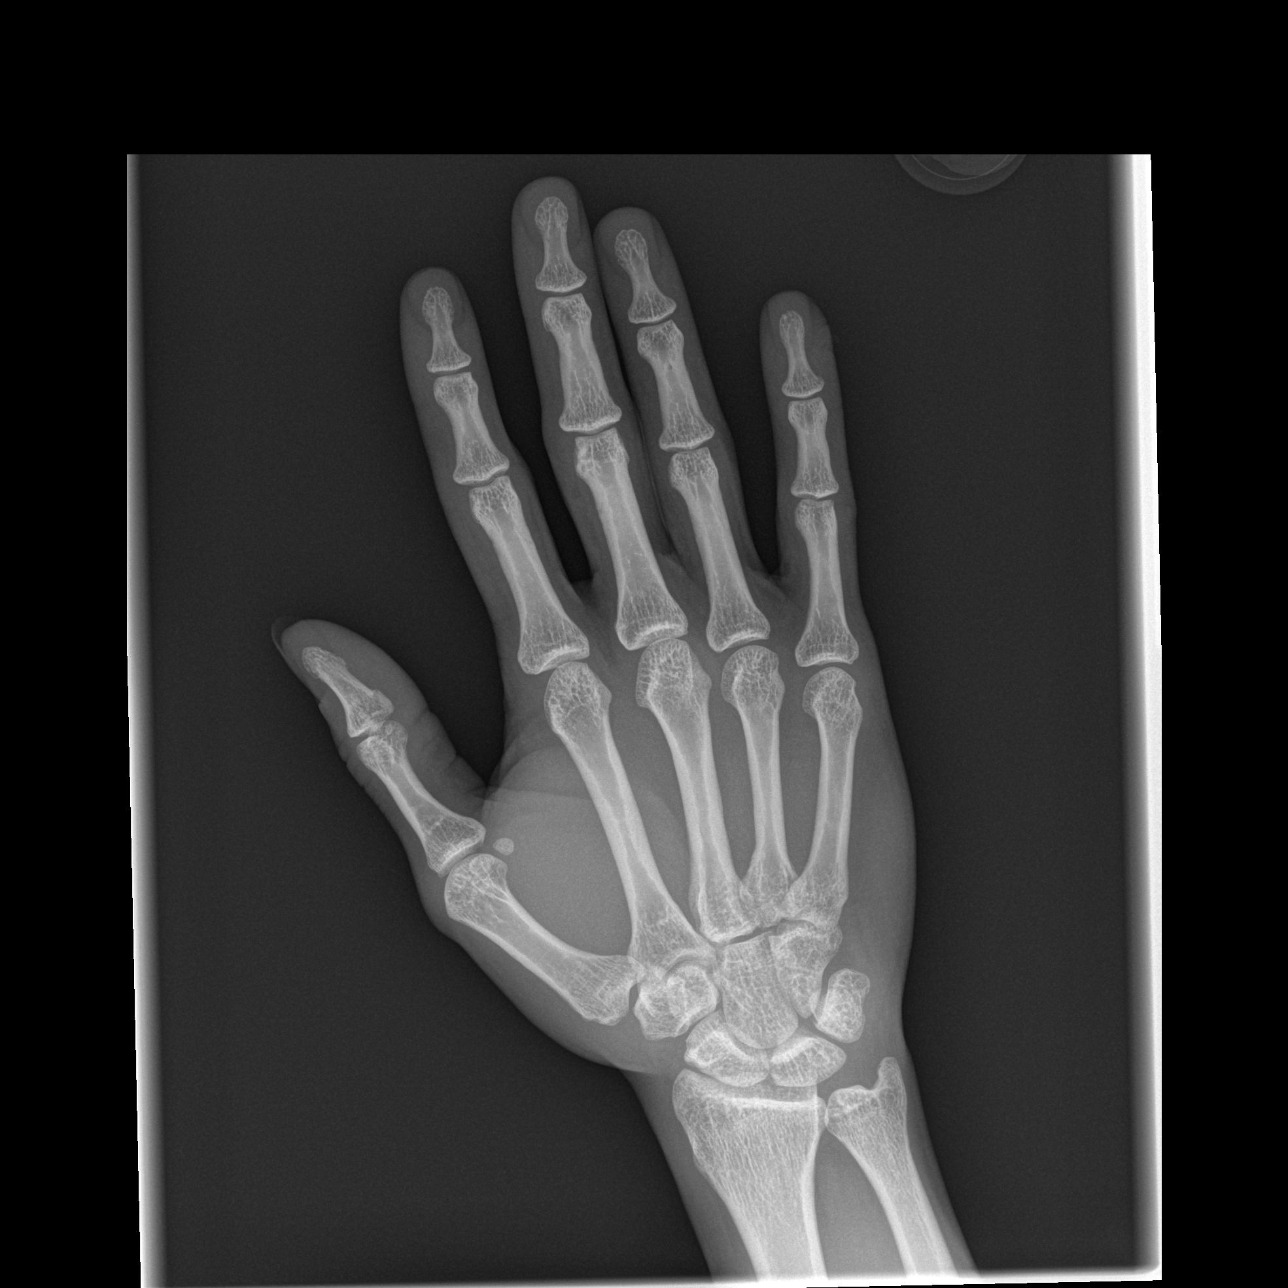

[x hand oblique right]
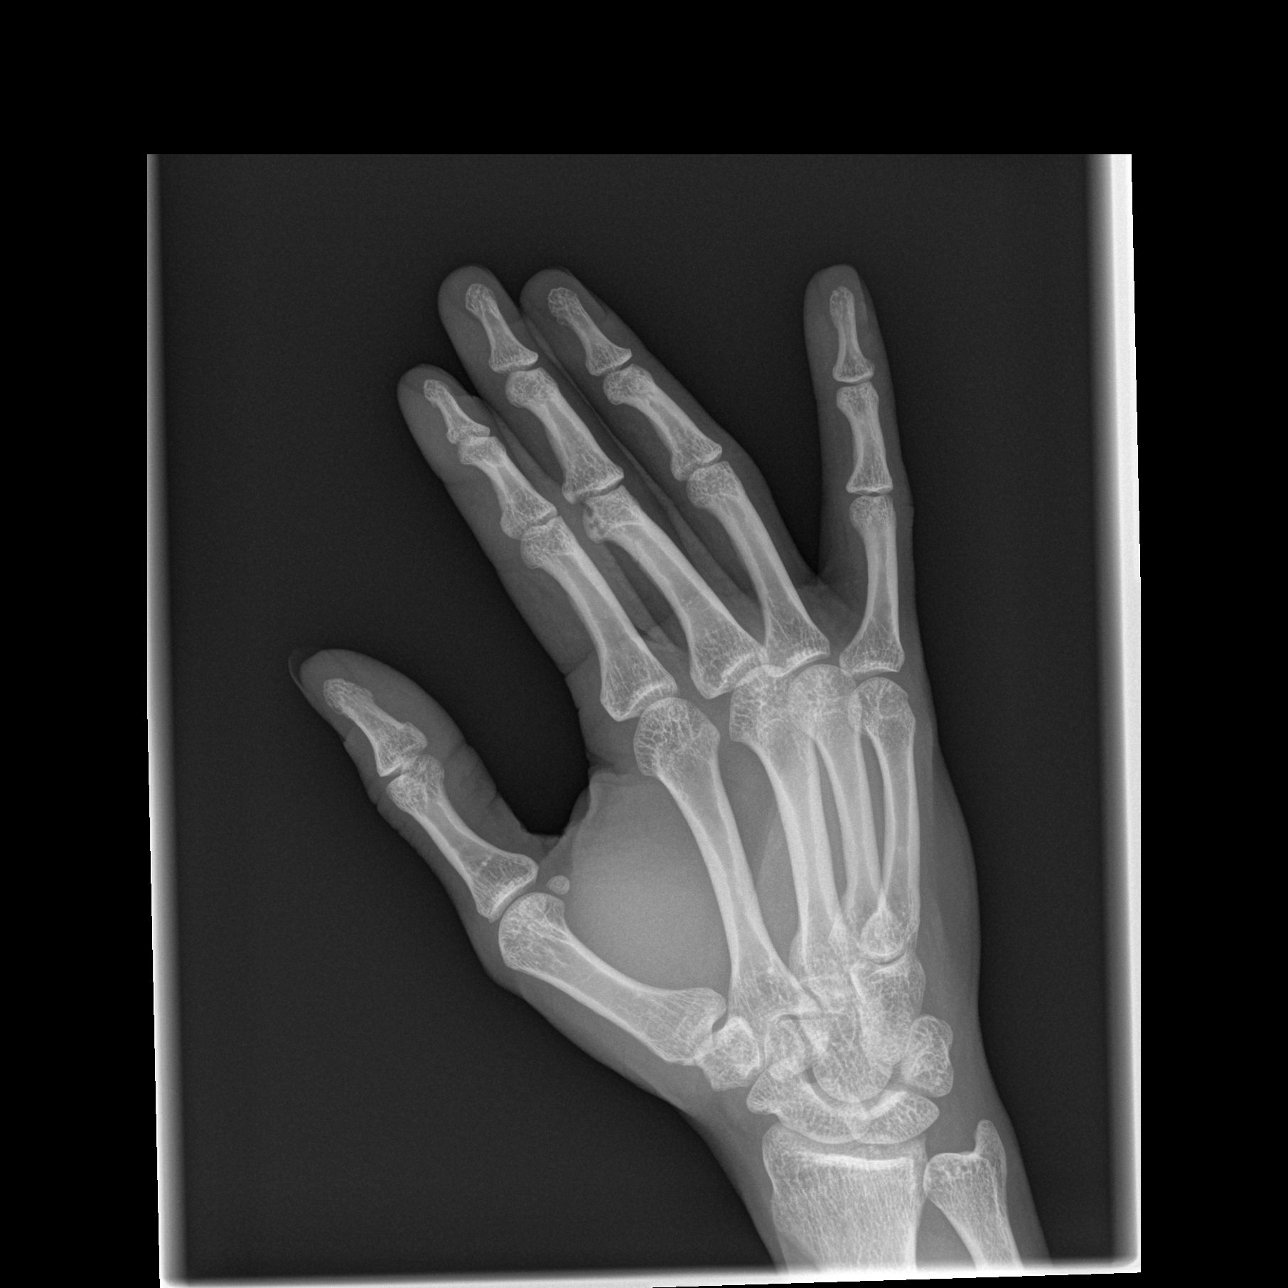

[x hand lat right]
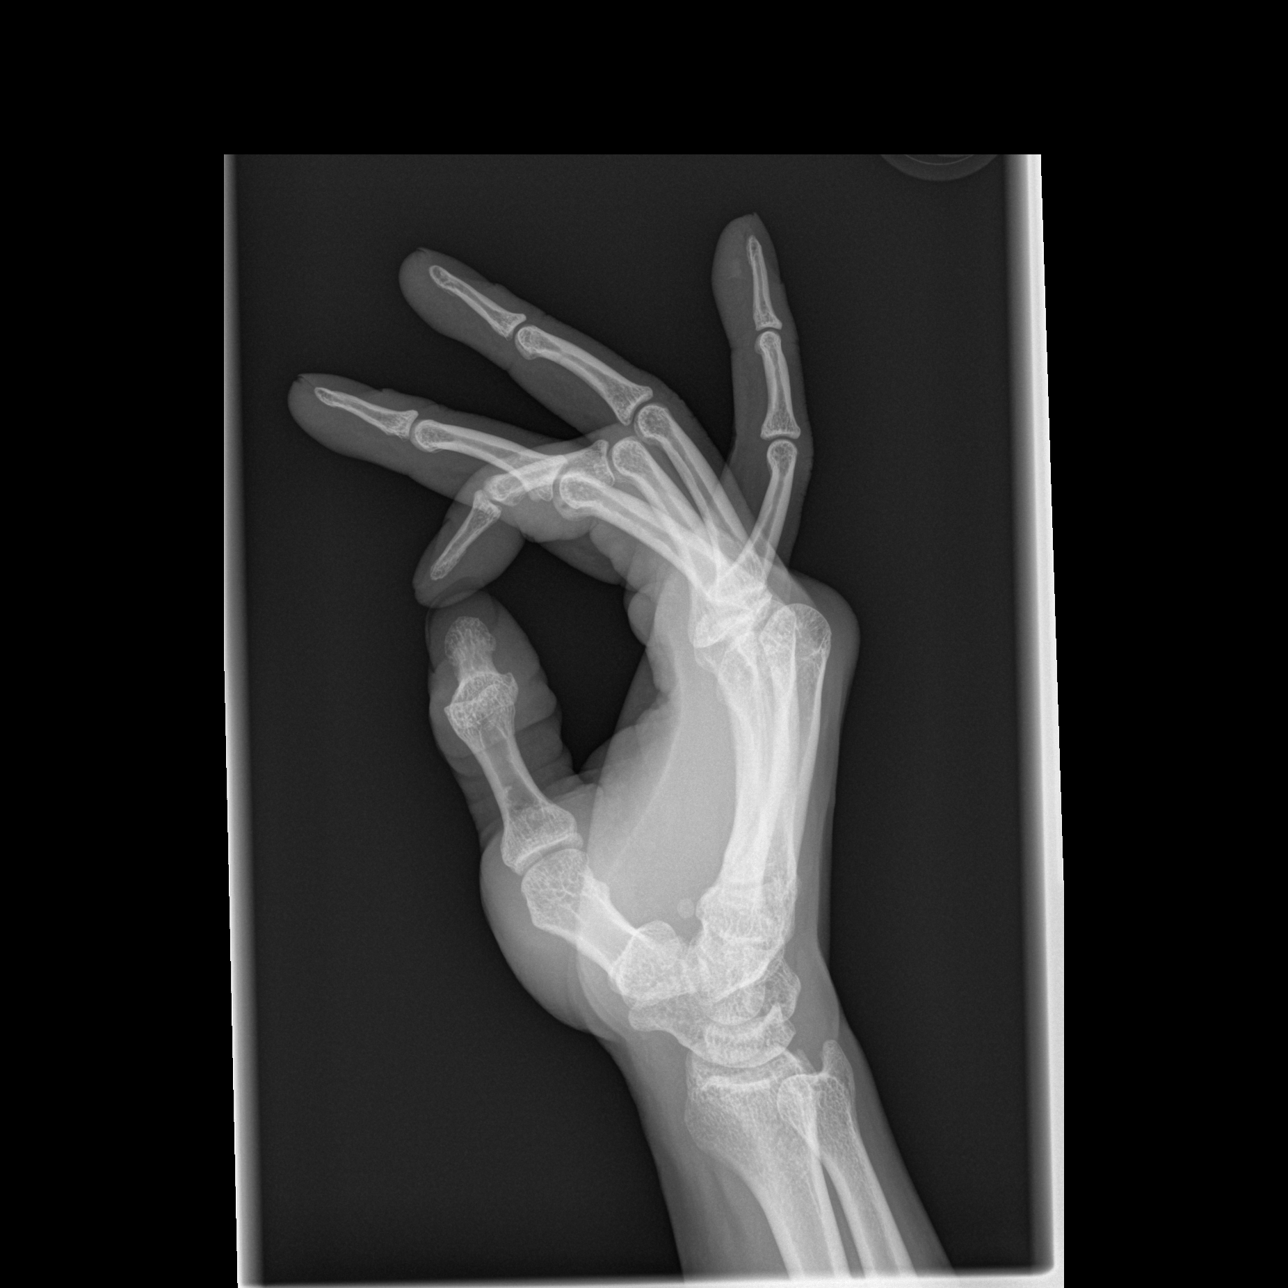

[3 of 3 positions shown; findings below may reference images not displayed]

FINDINGS: There is no evidence of fracture or dislocation. There is no
evidence of arthropathy or other focal bone abnormality. No definite
radiodense foreign body. Soft tissues are unremarkable.
IMPRESSION: Negative.

## 2018-08-17 DIAGNOSIS — F17200 Nicotine dependence, unspecified, uncomplicated: Secondary | ICD-10-CM | POA: Insufficient documentation

## 2022-08-12 ENCOUNTER — Other Ambulatory Visit: Payer: Self-pay

## 2022-08-12 ENCOUNTER — Encounter (HOSPITAL_COMMUNITY): Payer: Self-pay | Admitting: Emergency Medicine

## 2022-08-12 ENCOUNTER — Emergency Department (HOSPITAL_COMMUNITY)
Admission: EM | Admit: 2022-08-12 | Discharge: 2022-08-12 | Disposition: A | Discharge status: Home / Self Care | Payer: Medicaid Other | Attending: Emergency Medicine | Admitting: Emergency Medicine

## 2022-08-12 ENCOUNTER — Emergency Department (HOSPITAL_COMMUNITY): Payer: Medicaid Other

## 2022-08-12 DIAGNOSIS — M62830 Muscle spasm of back: Secondary | ICD-10-CM | POA: Insufficient documentation

## 2022-08-12 MED ORDER — METHOCARBAMOL 500 MG PO TABS
1000.0000 mg | ORAL_TABLET | Freq: Once | ORAL | Status: AC
  Administered 2022-08-12: 1000 mg via ORAL
  Filled 2022-08-12: qty 2

## 2022-08-12 MED ORDER — KETOROLAC TROMETHAMINE 15 MG/ML IJ SOLN
15.0000 mg | Freq: Once | INTRAMUSCULAR | Status: AC
  Administered 2022-08-12: 15 mg via INTRAMUSCULAR
  Filled 2022-08-12: qty 1

## 2022-08-12 MED ORDER — MELOXICAM 7.5 MG PO TABS: 7.5000 mg | ORAL_TABLET | Freq: Every day | ORAL | 0 refills | Status: DC

## 2022-08-12 MED ORDER — METHOCARBAMOL 500 MG PO TABS: 500.0000 mg | ORAL_TABLET | Freq: Four times a day (QID) | ORAL | 0 refills | Status: DC | PRN

## 2022-08-12 MED ORDER — LIDOCAINE 5 % EX PTCH
1.0000 | MEDICATED_PATCH | CUTANEOUS | Status: DC
  Administered 2022-08-12: 1 via TRANSDERMAL
  Filled 2022-08-12: qty 1

## 2022-08-12 MED ORDER — LIDOCAINE 5 % EX PTCH: 1.0000 | MEDICATED_PATCH | CUTANEOUS | 0 refills | Status: DC

## 2022-08-12 NOTE — ED Triage Notes (Signed)
Pt presents with upper back and neck x 3 weeks, per pt he lifted a car jack and every since, has had neck and back pain.

## 2022-08-12 NOTE — ED Provider Notes (Signed)
[place] EMERGENCY DEPARTMENT AT [facility] Provider Note   CSN: [id number] Arrival date & time: 08/12/22  1631     History  Chief Complaint  Patient presents with   Back Pain    Chase Olson is a 42 y.o. male.  He has history of right-sided rotator cuff tear, no chronic medical conditions.  Presents to ER with right neck and trapezius pain for the past 2 weeks.  He states he picked up a jack and felt a pull in the right upper back.  He states he felt okay yesterday but woke up the next morning with a lot of pain, pain goes into his neck and shoulder posteriorly.  Has been taking Tylenol and Aleve with mild relief only.  States the pain is progressing and now radiates to the right lateral chest, worse with movement of presumably moving his neck to the right side.  Denies any shortness of breath, fever chills, cough.   Back Pain      Home Medications Prior to Admission medications   Medication Sig Start Date End Date Taking? Authorizing Provider  lidocaine (LIDODERM) 5 % Place 1 patch onto the skin daily. Remove & Discard patch within 12 hours or as directed by MD 08/12/22  Yes Cristi Loron, Monalisa Bayless A, PA-C  meloxicam (MOBIC) 7.5 MG tablet Take 1 tablet (7.5 mg total) by mouth daily. 08/12/22  Yes Tuwanda Vokes A, PA-C  methocarbamol (ROBAXIN) 500 MG tablet Take 1 tablet (500 mg total) by mouth every 6 (six) hours as needed for muscle spasms. 08/12/22  Yes Shawnell Dykes A, PA-C      Allergies    Morphine, Tylenol [acetaminophen], and Vicodin [hydrocodone-acetaminophen]    Review of Systems   Review of Systems  Musculoskeletal:  Positive for back pain.    Physical Exam Updated Vital Signs BP (!) 123/107 (BP Location: Right Arm)   Pulse 66   Temp 99.5 F (37.5 C) (Oral)   Resp 14   Ht 5\' 6"  (1.676 m)   Wt 72.6 kg   SpO2 100%   BMI 25.82 kg/m  Physical Exam Vitals and nursing note reviewed.  Constitutional:      General: He is not in acute distress.     Appearance: He is well-developed.  HENT:     Head: Normocephalic and atraumatic.     Mouth/Throat:     Mouth: Mucous membranes are moist.  Eyes:     Conjunctiva/sclera: Conjunctivae normal.  Neck:     Comments: Motion is intact but patient has pain reproduced on rotation to the right. Cardiovascular:     Rate and Rhythm: Normal rate and regular rhythm.     Heart sounds: No murmur heard. Pulmonary:     Effort: Pulmonary effort is normal. No respiratory distress.     Breath sounds: Normal breath sounds.  Abdominal:     Palpations: Abdomen is soft.     Tenderness: There is no abdominal tenderness.  Musculoskeletal:        General: No swelling.     Cervical back: Neck supple.  Skin:    General: Skin is warm and dry.     Capillary Refill: Capillary refill takes less than 2 seconds.  Neurological:     General: No focal deficit present.     Mental Status: He is alert and oriented to person, place, and time.  Psychiatric:        Mood and Affect: Mood normal.     ED Results / Procedures / Treatments  Labs (all labs ordered are listed, but only abnormal results are displayed) Labs Reviewed - No data to display  EKG None  Radiology DG Chest Portable 1 View  Result Date: 08/12/2022 CLINICAL DATA:  Chest pain with movement. EXAM: PORTABLE CHEST 1 VIEW COMPARISON:  July 28, 2019 FINDINGS: The heart size and mediastinal contours are within normal limits. A trace amount of linear atelectasis is seen within the left lung base. There is no evidence of acute infiltrate, pleural effusion or pneumothorax. The visualized skeletal structures are unremarkable. IMPRESSION: No active disease. Electronically Signed   By: Aram Candela M.D.   On: 08/12/2022 19:46    Procedures Procedures    Medications Ordered in ED Medications  ketorolac (TORADOL) 15 MG/ML injection 15 mg (has no administration in time range)  lidocaine (LIDODERM) 5 % 1 patch (has no administration in time range)   methocarbamol (ROBAXIN) tablet 1,000 mg (has no administration in time range)    ED Course/ Medical Decision Making/ A&P                                 Medical Decision Making Differential diagnosis: Muscle spasm, strain, contusion, pneumothorax, other  Course: Patient having 2 weeks of left upper back and left neck pain that is completely reproducible.  Happened after picking up a car jack.  Discussed likely musculoskeletal issue.  Chest x-ray ordered shows no pneumothorax, no acute cardiopulmonary process.  He has no leg pain or swelling, no history of VTE, no shortness of breath or pleuritic pain.  Sats are 100% on room air.  Patient is negative on all PERC criteria, no workup for PE needed.  He has no chest pain and no cardiac risk factors.  Patient given Toradol, Robaxin and lidocaine patch here for discomfort.  Discussed results he is agreeable to follow-up with PCP and he was given strict return precautions.    Amount and/or Complexity of Data Reviewed Radiology: ordered and independent interpretation performed.    Details: No pneumothorax, no pleural effusion, no pulmonary edema or infiltrate  Risk Prescription drug management.           Final Clinical Impression(s) / ED Diagnoses Final diagnoses:  Muscle spasm of back    Rx / DC Orders ED Discharge Orders          Ordered    methocarbamol (ROBAXIN) 500 MG tablet  Every 6 hours PRN        08/12/22 2016    lidocaine (LIDODERM) 5 %  Every 24 hours        08/12/22 2016    meloxicam (MOBIC) 7.5 MG tablet  Daily        08/12/22 [address] 08/12/22 2028    Derwood Kaplan, MD 08/16/22 [phone]

## 2022-08-12 NOTE — Discharge Instructions (Addendum)
Pleasure taking care of you today.  I suspect you have some muscle spasm.  You given prescription for muscle relaxers, Mobic and lidocaine patches.  Regarding the muscle relaxer methocarbamol, do not drive or operate machinery while taking this as it can make you sleepy.  The meloxicam is an anti-inflammatory.  Similar to ibuprofen and Aleve so do not take these medications while taking this.  Is important follow with a primary care doctor.  Come back to the ER if you have new or worsening symptoms.  [facility] Primary Care Doctor List    Syliva Overman, MD. Specialty: [facility] Medicine Contact information: [address]  [place] [address]  [phone]   Lilyan Punt, MD. Specialty: [facility] Medicine Contact information: [address]  [place] [address]  [phone]   Avon Gully, MD Specialty: Internal Medicine Contact information: [address] [place] [address]  [phone]   Catalina Pizza, MD. Specialty: Internal Medicine Contact information: [address]  [place] [address]  [phone]    [facility] (Dr. Selena Batten) Specialty: Family Medicine Contact information: [address]  [place] [address]  [phone]   John Giovanni, MD. Specialty: [facility] Medicine Contact information: [address]  [address]  [place] [address]  [phone]   Carylon Perches, MD. Specialty: Internal Medicine Contact information: [address]  [address]  [place] [address]  [phone]    [facility]  [address] [place], [place] [address] [phone]  Services [facility] offers a variety of basic health services.  Services include but are not limited to: Blood pressure checks  Heart rate checks  Blood sugar checks  Urine analysis  Rapid strep tests  Pregnancy tests.  Health education and referrals  People needing more complex  services will be directed to a physician online. Using these virtual visits, doctors can evaluate and prescribe medicine and treatments. There will be no medication on-site, though [facility] will help patients fill their prescriptions at little to no cost.   For More information please go to: [email or web address]

## 2022-09-15 ENCOUNTER — Emergency Department (HOSPITAL_BASED_OUTPATIENT_CLINIC_OR_DEPARTMENT_OTHER)
Admission: EM | Admit: 2022-09-15 | Discharge: 2022-09-15 | Discharge status: Against Medical Advice | Payer: Medicaid Other

## 2022-09-15 DIAGNOSIS — R5383 Other fatigue: Secondary | ICD-10-CM | POA: Insufficient documentation

## 2022-09-15 DIAGNOSIS — R079 Chest pain, unspecified: Secondary | ICD-10-CM | POA: Diagnosis not present

## 2022-09-15 DIAGNOSIS — Z5321 Procedure and treatment not carried out due to patient leaving prior to being seen by health care provider: Secondary | ICD-10-CM | POA: Insufficient documentation

## 2022-09-15 DIAGNOSIS — Z20822 Contact with and (suspected) exposure to covid-19: Secondary | ICD-10-CM | POA: Diagnosis not present

## 2022-09-15 DIAGNOSIS — R1110 Vomiting, unspecified: Secondary | ICD-10-CM | POA: Diagnosis present

## 2022-09-15 DIAGNOSIS — R509 Fever, unspecified: Secondary | ICD-10-CM | POA: Diagnosis not present

## 2022-09-15 DIAGNOSIS — R0602 Shortness of breath: Secondary | ICD-10-CM | POA: Diagnosis not present

## 2022-09-15 LAB — RESP PANEL BY RT-PCR (RSV, FLU A&B, COVID)  RVPGX2
Influenza A by PCR: NEGATIVE
Influenza B by PCR: NEGATIVE
Resp Syncytial Virus by PCR: NEGATIVE
SARS Coronavirus 2 by RT PCR: NEGATIVE

## 2022-09-15 LAB — COMPREHENSIVE METABOLIC PANEL
ALT: 18 U/L (ref 0–44)
AST: 16 U/L (ref 15–41)
Albumin: 5.1 g/dL — ABNORMAL HIGH (ref 3.5–5.0)
Alkaline Phosphatase: 84 U/L (ref 38–126)
Anion gap: 14 (ref 5–15)
BUN: 12 mg/dL (ref 6–20)
CO2: 25 mmol/L (ref 22–32)
Calcium: 10.1 mg/dL (ref 8.9–10.3)
Chloride: 98 mmol/L (ref 98–111)
Creatinine, Ser: 0.97 mg/dL (ref 0.61–1.24)
GFR, Estimated: >60 mL/min (ref >60)
Glucose, Bld: 120 mg/dL — ABNORMAL HIGH (ref 70–99)
Potassium: 3.4 mmol/L — ABNORMAL LOW (ref 3.5–5.1)
Sodium: 137 mmol/L (ref 135–145)
Total Bilirubin: 0.5 mg/dL (ref 0.3–1.2)
Total Protein: 8.3 g/dL — ABNORMAL HIGH (ref 6.5–8.1)

## 2022-09-15 LAB — CBC
HCT: 47.7 % (ref 39.0–52.0)
Hemoglobin: 16.1 g/dL (ref 13.0–17.0)
MCH: 29 pg (ref 26.0–34.0)
MCHC: 33.8 g/dL (ref 30.0–36.0)
MCV: 85.8 fL (ref 80.0–100.0)
Platelets: 359 10*3/uL (ref 150–400)
RBC: 5.56 MIL/uL (ref 4.22–5.81)
RDW: 14.8 % (ref 11.5–15.5)
WBC: 16.7 10*3/uL — ABNORMAL HIGH (ref 4.0–10.5)
nRBC: 0 % (ref 0.0–0.2)

## 2022-09-15 LAB — LIPASE, BLOOD: Lipase: <10 U/L — ABNORMAL LOW (ref 11–51)

## 2022-09-15 MED ORDER — ONDANSETRON 4 MG PO TBDP
4.0000 mg | ORAL_TABLET | Freq: Once | ORAL | Status: AC | PRN
  Administered 2022-09-15: 4 mg via ORAL
  Filled 2022-09-15: qty 1

## 2022-09-15 NOTE — ED Triage Notes (Addendum)
Emesis since 6pm yesterday. Febrile at home. Chills, fatigue. -SOB,-CP.  Meds pepto at home. Denies daily meds.  No HX reported.

## 2023-06-11 ENCOUNTER — Other Ambulatory Visit: Payer: Self-pay

## 2023-06-11 ENCOUNTER — Emergency Department (HOSPITAL_BASED_OUTPATIENT_CLINIC_OR_DEPARTMENT_OTHER): Admitting: Radiology

## 2023-06-11 ENCOUNTER — Emergency Department (HOSPITAL_BASED_OUTPATIENT_CLINIC_OR_DEPARTMENT_OTHER)

## 2023-06-11 ENCOUNTER — Emergency Department (HOSPITAL_BASED_OUTPATIENT_CLINIC_OR_DEPARTMENT_OTHER)
Admission: EM | Admit: 2023-06-11 | Discharge: 2023-06-11 | Disposition: A | Discharge status: Home / Self Care | Attending: Emergency Medicine | Admitting: Emergency Medicine

## 2023-06-11 DIAGNOSIS — R202 Paresthesia of skin: Secondary | ICD-10-CM | POA: Diagnosis not present

## 2023-06-11 DIAGNOSIS — M542 Cervicalgia: Secondary | ICD-10-CM

## 2023-06-11 DIAGNOSIS — M25511 Pain in right shoulder: Secondary | ICD-10-CM

## 2023-06-11 MED ORDER — DIPHENHYDRAMINE HCL 25 MG PO CAPS
25.0000 mg | ORAL_CAPSULE | Freq: Once | ORAL | Status: AC
  Administered 2023-06-11: 25 mg via ORAL
  Filled 2023-06-11: qty 1

## 2023-06-11 MED ORDER — ACETAMINOPHEN 500 MG PO TABS
1000.0000 mg | ORAL_TABLET | Freq: Once | ORAL | Status: AC
  Administered 2023-06-11: 1000 mg via ORAL
  Filled 2023-06-11: qty 2

## 2023-06-11 MED ORDER — IBUPROFEN 800 MG PO TABS: 800.0000 mg | ORAL_TABLET | Freq: Once | ORAL | Status: DC

## 2023-06-11 MED ORDER — CYCLOBENZAPRINE HCL 10 MG PO TABS: 10.0000 mg | ORAL_TABLET | Freq: Two times a day (BID) | ORAL | 0 refills | Status: DC | PRN

## 2023-06-11 NOTE — ED Triage Notes (Signed)
 Pt c/o neck pain radiating to the shoulder that started 2 weeks ago and has been worsening. Pt also reports that depending on positioning numbness/tingling is intermittent in extremities. Pt denies any recent trauma/injury. Last took meloxicam  at 1000 this morning.

## 2023-06-11 NOTE — ED Notes (Signed)
Reviewed discharge instructions, medications, and home care with pt. Pt verbalized understanding and had no further questions. Pt exited ED without complications.

## 2023-06-11 NOTE — ED Provider Notes (Signed)
 [place] EMERGENCY DEPARTMENT AT [facility] Provider Note   CSN: [id number] Arrival date & time: 06/11/23  1517     History  Chief Complaint  Patient presents with   Neck Pain   HPI Chase Olson is a 43 y.o. male presenting for neck pain.  He states has been intermittent for the past 2 months but worse in the last 2 weeks.  Primarily right-sided posterior neck pain that does radiate to the right shoulder.  He reports in the last 4 days he has had some numbness and tingling in his right arm in his fingers.  Has been seen recently by his PCP and has been taking meloxicam  daily which has helped somewhat.  Denies any pain radiating into his chest.  Denies fever or IV drug use.  Denies nuchal rigidity and headache.  He also mentioned that he has a job where he raises his right arm above his head frequently during the day.  He states that this movement does make it worse.  Denies trauma.   Neck Pain      Home Medications Prior to Admission medications   Medication Sig Start Date End Date Taking? Authorizing Provider  cyclobenzaprine  (FLEXERIL ) 10 MG tablet Take 1 tablet (10 mg total) by mouth 2 (two) times daily as needed for muscle spasms. 06/11/23  Yes Keaston Pile K, PA-C  lidocaine  (LIDODERM ) 5 % Place 1 patch onto the skin daily. Remove & Discard patch within 12 hours or as directed by MD 08/12/22   Baxter Limber A, PA-C  meloxicam  (MOBIC ) 7.5 MG tablet Take 1 tablet (7.5 mg total) by mouth daily. 08/12/22   Baxter Limber A, PA-C  methocarbamol  (ROBAXIN ) 500 MG tablet Take 1 tablet (500 mg total) by mouth every 6 (six) hours as needed for muscle spasms. 08/12/22   Baxter Limber A, PA-C      Allergies    Morphine , Tylenol  [acetaminophen ], and Vicodin [hydrocodone -acetaminophen ]    Review of Systems   Review of Systems  Musculoskeletal:  Positive for neck pain.    Physical Exam   Vitals:   06/11/23 1527  BP: (!) 155/97  Pulse: 99  Resp: 19  Temp: 98.4 F (36.9  C)  SpO2: 95%    CONSTITUTIONAL:  well-appearing, NAD NEURO:  GCS 15. Speech is goal oriented. No deficits appreciated to CN III-XII; symmetric eyebrow raise, no facial drooping, tongue midline. Patient has equal grip strength bilaterally with 5/5 strength against resistance in all major muscle groups bilaterally. Sensation to light touch intact. Patient moves extremities without ataxia. Normal finger-nose-finger. Patient ambulatory with steady gait. EYES:  eyes equal and reactive ENT/NECK:  Supple, no stridor , range of motion normal without pain.  No tenderness with palpation to the posterior neck.  No crepitance, ecchymosis erythema or edema noted. CARDIO:  regular rate and rhythm, appears well-perfused  PULM:  No respiratory distress, CTAB GI/GU:  non-distended MSK/SPINE:  No gross deformities, no edema, moves all extremities  SKIN:  no rash, atraumatic   *Additional and/or pertinent findings included in MDM below    ED Results / Procedures / Treatments   Labs (all labs ordered are listed, but only abnormal results are displayed) Labs Reviewed - No data to display  EKG None  Radiology DG Shoulder Right Result Date: 06/11/2023 CLINICAL DATA:  Pain radiating to the right shoulder starting 2 weeks ago. Worsening. EXAM: RIGHT SHOULDER - 2+ VIEW COMPARISON:  None Available. FINDINGS: Mild inferior glenoid degenerative spurring. The glenohumeral and acromioclavicular joint spaces  are maintained. Mild lateral downsloping of the acromion. No acute fracture or dislocation. IMPRESSION: Mild inferior glenoid degenerative spurring. Electronically Signed   By: Bertina Broccoli M.D.   On: 06/11/2023 18:40   CT Cervical Spine Wo Contrast Result Date: 06/11/2023 CLINICAL DATA:  Neck pain radiating to right shoulder for 2 weeks, numbness and tingling, no recent trauma EXAM: CT CERVICAL SPINE WITHOUT CONTRAST TECHNIQUE: Multidetector CT imaging of the cervical spine was performed without intravenous  contrast. Multiplanar CT image reconstructions were also generated. RADIATION DOSE REDUCTION: This exam was performed according to the departmental dose-optimization program which includes automated exposure control, adjustment of the mA and/or kV according to patient size and/or use of iterative reconstruction technique. COMPARISON:  None Available. FINDINGS: Alignment: Alignment is anatomic. Skull base and vertebrae: No acute fracture. No primary bone lesion or focal pathologic process. Soft tissues and spinal canal: No prevertebral fluid or swelling. No visible canal hematoma. Disc levels: Mild spondylosis at C3-4 without significant compressive sequela. Mild spondylosis and bilateral uncovertebral hypertrophy at C5-6, with minimal right neural foraminal encroachment. At C6-7 there is moderate spondylosis with circumferential disc osteophyte complex resulting in symmetrical bilateral neural foraminal encroachment. Mild spondylosis at C7-T1 without compressive sequela. Upper chest: Airway is patent.  Lung apices are clear. Other: Reconstructed images demonstrate no additional findings. IMPRESSION: 1. No acute cervical spine fracture. 2. Multilevel cervical spondylosis as above, most pronounced at the C6-7 level. Electronically Signed   By: Bobbye Burrow M.D.   On: 06/11/2023 17:27    Procedures Procedures    Medications Ordered in ED Medications  acetaminophen  (TYLENOL ) tablet 1,000 mg (1,000 mg Oral Given 06/11/23 1854)  diphenhydrAMINE  (BENADRYL ) capsule 25 mg (25 mg Oral Given 06/11/23 1855)    ED Course/ Medical Decision Making/ A&P                                 Medical Decision Making Amount and/or Complexity of Data Reviewed Radiology: ordered.  Risk OTC drugs.   43 year old well-appearing male presenting for neck pain and right shoulder pain. Exam was unremarkable. DDx includes cervical radiculopathy, spinal epidural abscess, fracture dislocation, rotator cuff injury, other.  Right  shoulder x-ray revealed mild inferior glenoid degenerative spurring but otherwise nonacute. CT cervical spine was also without acute findings.  See details above.  Shared findings with patient and he could be contributing to his pain.  Since consider rotator cuff injury but range of motion seems to be normal in the right shoulder with minimal tenderness.  Also the shoulder does not appear to be infected.  Suspect chronic muscular pain.  Advised to continue taking the meloxicam  in addition to RICE treatment at home.  Sent Flexeril  to his pharmacy.  Also advised to follow-up with PCP.  Discussed return precautions.  Discharged.         Final Clinical Impression(s) / ED Diagnoses Final diagnoses:  Neck pain  Right shoulder pain, unspecified chronicity    Rx / DC Orders ED Discharge Orders          Ordered    cyclobenzaprine  (FLEXERIL ) 10 MG tablet  2 times daily PRN        06/11/23 1855              Janalee Mcmurray, PA-C 06/11/23 1856    Scarlette Currier, MD 06/13/23 0134

## 2023-06-11 NOTE — Discharge Instructions (Addendum)
 Patient for your right shoulder and neck pain were overall reassuring.  Please follow-up with your PCP.  Recommend he continue taking the meloxicam .  Also sent a muscle relaxer to your pharmacy.  Also recommend gentle stretching every day along with applying ice 3-4 times a day.

## 2023-06-29 ENCOUNTER — Ambulatory Visit: Payer: Self-pay | Admitting: *Deleted

## 2023-06-29 NOTE — Telephone Encounter (Signed)
 Copied from CRM [id number]. Topic: Clinical - Red Word Triage >> Jun 29, 2023  1:45 PM Rachelle R wrote: [facility] that prompted transfer to Nurse Triage: Patient states he was in the ER on 06/05 for a bone spur causing pain in his right should, back and arm. States the pain was okay for a few days after the visit but has progressively been getting worse. Reason for Disposition  [1] MODERATE back pain (e.g., interferes with normal activities) AND [2] present > 3 days    ED referred him to [facility] Medicine Center at [facility] location.  Answer Assessment - Initial Assessment Questions 1. ONSET: When did the pain begin?      Pt went to ED and told  him he has a bone spur.   He is to set up an appt.   The doctor told me to call the number on the paper.   [facility].   He wants me to see an orthopedic dr. [facility].     The Patient Access Specialist transferred him to me. No triage needed.    I gave him the number for the Sports[facility]icine Clinic at the [facility] location.  He hung up immediately upon me giving him the number.  2. LOCATION: Where does it hurt? (upper, mid or lower back)     He was seen for neck pain in the ED and told he has a bone spur causing pain in his back, shoulder and arm. 3. SEVERITY: How bad is the pain?  (e.g., Scale 1-10; mild, moderate, or severe)   - MILD (1-3): Doesn't interfere with normal activities.    - MODERATE (4-7): Interferes with normal activities or awakens from sleep.    - SEVERE (8-10): Excruciating pain, unable to do any normal activities.       4. PATTERN: Is the pain constant? (e.g., yes, no; constant, intermittent)       5. RADIATION: Does the pain shoot into your legs or somewhere else?      6. CAUSE:  What do you think is causing the back pain?       7. BACK OVERUSE:  Any recent lifting of heavy objects, strenuous work or exercise?      8. MEDICINES:  What have you taken so far for the pain? (e.g., nothing, acetaminophen , NSAIDS)      9. NEUROLOGIC SYMPTOMS: Do you have any weakness, numbness, or problems with bowel/bladder control?      10. OTHER SYMPTOMS: Do you have any other symptoms? (e.g., fever, abdomen pain, burning with urination, blood in urine)        11. PREGNANCY: Is there any chance you are pregnant? When was your last menstrual period?  Protocols used: Back Pain-A-AH FYI Only or Action Required?:  FYI only for provider.  Patient was last seen in primary care on ED ED. Called Nurse Triage reporting Back Pain. Symptoms began today. Interventions attempted: Nothing. Symptoms are: stable.  Triage Disposition: See PCP When Office is Open (Within 3 Days) Pt was given the number for the Sports Medicine Clinic at the [facility] and he hung up immediately after I gave him the number.  Patient/caregiver understands and will follow disposition?:

## 2023-07-16 ENCOUNTER — Ambulatory Visit: Admitting: Student in an Organized Health Care Education/Training Program

## 2023-07-16 VITALS — BP 145/81 | HR 86 | Ht 65.2 in | Wt 147.0 lb

## 2023-07-16 DIAGNOSIS — M5412 Radiculopathy, cervical region: Secondary | ICD-10-CM | POA: Diagnosis not present

## 2023-07-16 DIAGNOSIS — Z131 Encounter for screening for diabetes mellitus: Secondary | ICD-10-CM | POA: Diagnosis not present

## 2023-07-16 DIAGNOSIS — Z13220 Encounter for screening for lipoid disorders: Secondary | ICD-10-CM | POA: Diagnosis not present

## 2023-07-16 DIAGNOSIS — I10 Essential (primary) hypertension: Secondary | ICD-10-CM | POA: Diagnosis not present

## 2023-07-16 DIAGNOSIS — Z114 Encounter for screening for human immunodeficiency virus [HIV]: Secondary | ICD-10-CM

## 2023-07-16 DIAGNOSIS — Z1159 Encounter for screening for other viral diseases: Secondary | ICD-10-CM

## 2023-07-16 LAB — BASIC METABOLIC PANEL WITH GFR
BUN: 13 mg/dL (ref 6–23)
CO2: 26 meq/L (ref 19–32)
Calcium: 10.1 mg/dL (ref 8.4–10.5)
Chloride: 101 meq/L (ref 96–112)
Creatinine, Ser: 0.98 mg/dL (ref 0.40–1.50)
GFR: 94.31 mL/min (ref >60.00)
Glucose, Bld: 89 mg/dL (ref 70–99)
Potassium: 4.3 meq/L (ref 3.5–5.1)
Sodium: 135 meq/L (ref 135–145)

## 2023-07-16 LAB — LIPID PANEL
Cholesterol: 182 mg/dL (ref 0–200)
HDL: 89.8 mg/dL (ref >39.00)
LDL Cholesterol: 79 mg/dL (ref 0–99)
NonHDL: 91.95
Total CHOL/HDL Ratio: 2
Triglycerides: 63 mg/dL (ref 0.0–149.0)
VLDL: 12.6 mg/dL (ref 0.0–40.0)

## 2023-07-16 LAB — HEMOGLOBIN A1C: Hgb A1c MFr Bld: 6.4 % (ref 4.6–6.5)

## 2023-07-16 MED ORDER — DULOXETINE HCL 30 MG PO CPEP
30.0000 mg | ORAL_CAPSULE | Freq: Every day | ORAL | 1 refills | Status: DC
Start: 2023-07-16 — End: 2023-10-22

## 2023-07-16 MED ORDER — MELOXICAM 7.5 MG PO TABS
7.5000 mg | ORAL_TABLET | Freq: Every day | ORAL | 1 refills | Status: DC | PRN
Start: 2023-07-16 — End: 2023-08-17

## 2023-07-16 MED ORDER — CYCLOBENZAPRINE HCL 5 MG PO TABS
5.0000 mg | ORAL_TABLET | Freq: Every day | ORAL | 1 refills | Status: DC | PRN
Start: 2023-07-16 — End: 2023-08-17

## 2023-07-16 NOTE — Assessment & Plan Note (Signed)
 Blood pressure initially 175/84, reduced to 145/81 upon recheck. Variable readings, no prior treatment or family history.  I recommended lifestyle modifications for now.  Would like to work more on smoking cessation.  Very likely will want to use an antihypertensive in the near future.  Will check for ischemic vascular risk today with labs.

## 2023-07-16 NOTE — Patient Instructions (Signed)
  VISIT SUMMARY: During your visit, we discussed your ongoing neck and back pain, which radiates to your arms and causes tingling and numbness. We also reviewed your chronic rib pain and noted your elevated blood pressure readings. We have developed a plan to address these issues and improve your overall health.  YOUR PLAN: -CERVICAL RADICULOPATHY: Cervical radiculopathy is a condition where a nerve in your neck is compressed or irritated, causing pain, numbness, or tingling in your arms. We will order an MRI of your cervical spine to check for nerve compression and disc issues. You are prescribed Cymbalta  for chronic nerve pain and sleep disturbances, which you should take daily for at least two weeks to see improvement. Continue using meloxicam  on bad days, but avoid daily use to prevent ulcers. Flexeril  is prescribed for muscle relaxation as needed, but avoid using it during work due to potential drowsiness. Based on the MRI results, we may refer you to orthopedics for further treatment.  -HYPERTENSION: Hypertension, or high blood pressure, was noted during your visit with an initial reading of 175/84, which reduced to 145/81. This condition can increase your risk of heart disease and stroke. We will monitor your blood pressure and consider lifestyle changes or medication if necessary.  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 This pain is intermittent and worsens with certain positions. We will consider further evaluation if your symptoms persist or worsen.  -GENERAL HEALTH MAINTENANCE: We will conduct blood work to check your kidney function, potassium levels, cholesterol, and blood sugar to ensure your overall health is maintained.  INSTRUCTIONS: Please schedule a follow-up appointment in one month to reassess your pain management and review the MRI results. Continue taking your medications as prescribed and monitor your  symptoms. If you experience any worsening of your symptoms or new issues, contact our office immediately.

## 2023-07-16 NOTE — Assessment & Plan Note (Signed)
 Chronic neck pain radiating to the right arm with numbness and tingling, suggestive of cervical radiculopathy. Symptoms exacerbated by certain positions. Previous imaging showed a bone spur. Differential includes disc herniation, arthritis, or bone spur impingement.  Pain has been present for over 3 months, starting to limit his ability to work.  No high risk features such as fever, trauma, or rapid neurologic changes.  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 - Order MRI of the cervical spine to assess for nerve compression and disc pathology. - Prescribe Cymbalta  for chronic nerve pain and associated sleep disturbances, requires daily use for at least two weeks to be effective. - Refill meloxicam  for use on bad days, caution against daily use to prevent ulcers. - Prescribe Flexeril  as needed for muscle relaxation, advise against use during work due to drowsiness. - Considering referral to orthopedics based on MRI results for potential procedural intervention.

## 2023-07-16 NOTE — Progress Notes (Signed)
 New Patient Office Visit  Subjective    Patient ID: Chase Olson, male    DOB: 08/27/1980  Age: 43 y.o. MRN: [id number]  CC:  Chief Complaint  Patient presents with   Establish Care    Patient would like to discuss spur in shoulder and pain in rib area     HPI  Discussed the use of AI scribe software for clinical note transcription with the patient, who gave verbal consent to proceed.  History of Present Illness Chase Olson is a 43 year old male who presents with neck and back pain radiating to the arms, causing tingling and numbness. He is accompanied by his wife.  He has been experiencing severe neck and back pain radiating down his right arm, causing tingling and numbness in his fingertips. Recently, similar symptoms have developed in his left arm. These symptoms have persisted for approximately three months, initially improving with muscle relaxers but returning after stopping the medication. 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  He reports being told during two hospital visits that he has a bone spur pressing on a nerve, based on x-rays and other tests. Despite treatment with muscle relaxers, the pain persists, impacting his ability to work comfortably and causing concern about future work capability. The pain is described as not only tingling but also becoming painful over time, with certain positions exacerbating the symptoms.  He also reports chronic pain in his left rib area, ongoing for years following a diagnosis of a contusion. Despite previous x-rays, the pain persists, affecting his ability to find a comfortable position while driving.  His current medications include meloxicam , taken as needed for pain relief, though it only provides slight relief. He also uses Tylenol  PM occasionally to aid  sleep, which is often disrupted by his symptoms. He has a history of potassium deficiency, for which he takes potassium supplements.  In his social history, he smokes cigarettes and occasionally uses marijuana. He has a history of multiple surgeries, including two on his shoulder for a rotator cuff issue and two on his ankle due to instability. 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  No fevers, chills, recent falls, or weakness in hands. He experiences persistent pins and needles sensations. He has not been on treatment for high blood pressure, although his blood pressure readings have fluctuated in the past.    Outpatient Encounter Medications as of 07/16/2023  Medication Sig   DULoxetine  (CYMBALTA ) 30 MG capsule Take 1 capsule (30 mg total) by mouth daily.   [DISCONTINUED] meloxicam  (MOBIC ) 7.5 MG tablet Take 1 tablet (7.5 mg total) by mouth daily.   cyclobenzaprine  (FLEXERIL ) 5 MG tablet Take 1 tablet (5 mg total) by mouth daily as needed for muscle spasms.   meloxicam  (MOBIC ) 7.5 MG tablet Take 1 tablet (7.5 mg total) by mouth daily as needed for pain.   [DISCONTINUED] cyclobenzaprine  (FLEXERIL ) 10 MG tablet Take 1 tablet (10 mg total) by mouth 2 (two) times daily as needed for muscle spasms. (Patient not taking: Reported on 07/16/2023)   [DISCONTINUED] lidocaine  (LIDODERM ) 5 % Place 1 patch onto the skin daily. Remove & Discard patch within 12 hours or as directed by MD (Patient not taking: Reported on 07/16/2023)   [DISCONTINUED] methocarbamol  (ROBAXIN ) 500 MG  tablet Take 1 tablet (500 mg total) by mouth every 6 (six) hours as needed for muscle spasms. (Patient not taking: Reported on 07/16/2023)   No facility-administered encounter medications on file as of 07/16/2023.    Past Medical History:  Diagnosis Date   Asthma    History of asthma    as a child   Shoulder dislocation 03/2013    left    Past Surgical History:  Procedure Laterality Date   ANKLE ARTHROSCOPY Right    HERNIA REPAIR Right 01/07/2007   INGUINAL HERNIA REPAIR Right    ROTATOR CUFF REPAIR Left    SHOULDER ARTHROSCOPY WITH LABRAL REPAIR Left 03/21/2013   Procedure: LEFT SHOULDER ARTHROSCOPY LABRAL REPAIR;  Surgeon: Eva Elsie Herring, MD;  Location: [facility];  Service: Orthopedics;  Laterality: Left;    No family history on file.  Social History   Socioeconomic History   Marital status: Single    Spouse name: Not on file   Number of children: Not on file   Years of education: Not on file   Highest education level: Not on file  Occupational History   Not on file  Tobacco Use   Smoking status: Every Day    Current packs/day: 0.50    Average packs/day: 0.5 packs/day for 5.0 years (2.5 ttl pk-yrs)    Types: Cigarettes   Smokeless tobacco: Never  Substance and Sexual Activity   Alcohol use: Yes    Comment: rarely   Drug use: No    Types: Marijuana    Comment: last used 02/2013   Sexual activity: Not on file  Other Topics Concern   Not on file  Social History Narrative   Not on file   Social Drivers of Health   Financial Resource Strain: Not on file  Food Insecurity: Not on file  Transportation Needs: Not on file  Physical Activity: Not on file  Stress: No Stress Concern Present (02/18/2021)   Received from [facility] Occup[facility] - Occupational Stress Questionnaire    Feeling of Stress : Only a little  Social Connections: Unknown (05/08/2021)   [facility] from [facility]   Social Network    Social Network: Not on file  Intimate Partner Violence: Not At Risk (02/16/2023)   Received from [facility]   HITS    Over the last 12 months how often did your partner physically hurt you?: Never    Over the last 12 months how often did your partner insult you or talk down to you?: Never    Over the last 12 months how often did your  partner threaten you with physical harm?: Never    Over the last 12 months how often did your partner scream or curse at you?: Never       Objective    BP (!) 145/81 (BP Location: Left Arm, Cuff Size: Normal)   Pulse 86   Ht 5' 5.2 (1.656 m)   Wt 147 lb (66.7 kg)   BMI 24.31 kg/m   Physical Exam  Physical Exam Gen: Well-appearing man HEENT: Eyes normal. Tympanic membranes normal. Pharynx normal. Dentition normal. NECK: Thyroid normal.  No adenopathy or nodules CHEST: Lungs clear to auscultation, no crackles. CARDIOVASCULAR: Heart regular rate and rhythm, no murmurs. Abd: Soft, nontender, no organomegaly EXTREMITIES: No swelling in extremities, joints normal. NEUROLOGICAL: Hand strength normal.  Normal Tinel and Phalen tests of the carpal tunnels.  Full strength upper and lower extremities.  Normal brachial reflexes  bilaterally. Psych: Appropriate mood and affect, not anxious or depressed.      Assessment & Plan:   Problem List Items Addressed This Visit       High   Cervical radiculopathy, chronic - Primary (Chronic)   Chronic neck pain radiating to the right arm with numbness and tingling, suggestive of cervical radiculopathy. Symptoms exacerbated by certain positions. Previous imaging showed a bone spur. Differential includes disc herniation, arthritis, or bone spur impingement.  Pain has been present for over 3 months, starting to limit his ability to work.  No high risk features such as fever, trauma, or rapid neurologic changes.  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 - Order MRI of the cervical spine to assess for nerve compression and disc pathology. - Prescribe Cymbalta  for chronic nerve pain and associated sleep disturbances, requires daily use for at least two weeks to be effective. - Refill meloxicam  for use on bad days, caution against daily use to prevent ulcers. - Prescribe Flexeril  as  needed for muscle relaxation, advise against use during work due to drowsiness. - Considering referral to orthopedics based on MRI results for potential procedural intervention.      Relevant Medications   cyclobenzaprine  (FLEXERIL ) 5 MG tablet   meloxicam  (MOBIC ) 7.5 MG tablet   DULoxetine  (CYMBALTA ) 30 MG capsule   Other Relevant Orders   MR Cervical Spine Wo Contrast     Medium    Hypertension (Chronic)   Blood pressure initially 175/84, reduced to 145/81 upon recheck. Variable readings, no prior treatment or family history.  I recommended lifestyle modifications for now.  Would like to work more on smoking cessation.  Very likely will want to use an antihypertensive in the near future.  Will check for ischemic vascular risk today with labs.      Relevant Orders   Basic metabolic panel with GFR   Other Visit Diagnoses       Screening for diabetes mellitus       Relevant Orders   Hemoglobin A1c     Screening for lipid disorders       Relevant Orders   Lipid panel     Encounter for HCV screening test for low risk patient       Relevant Orders   Hepatitis C antibody     Screening for HIV (human immunodeficiency virus)       Relevant Orders   HIV Antibody (routine testing w rflx)       Return in about 4 weeks (around 08/13/2023) for neck pain management.   Cleatus Debby Specking, MD

## 2023-07-17 ENCOUNTER — Ambulatory Visit: Payer: Self-pay | Admitting: Student in an Organized Health Care Education/Training Program

## 2023-07-17 DIAGNOSIS — R7303 Prediabetes: Secondary | ICD-10-CM | POA: Insufficient documentation

## 2023-07-17 LAB — HIV ANTIBODY (ROUTINE TESTING W REFLEX): HIV 1&2 Ab, 4th Generation: NONREACTIVE

## 2023-07-17 LAB — HEPATITIS C ANTIBODY: Hepatitis C Ab: NONREACTIVE

## 2023-08-13 ENCOUNTER — Ambulatory Visit: Admitting: Student in an Organized Health Care Education/Training Program

## 2023-08-14 ENCOUNTER — Other Ambulatory Visit: Payer: Self-pay

## 2023-08-14 ENCOUNTER — Ambulatory Visit: Payer: Self-pay

## 2023-08-14 DIAGNOSIS — M5412 Radiculopathy, cervical region: Secondary | ICD-10-CM

## 2023-08-14 NOTE — Telephone Encounter (Signed)
 FYI Only or Action Required?: FYI only for provider.  Patient was last seen in primary care on 07/16/2023 by Jerrell Cleatus Ned, MD.  Called Nurse Triage reporting  upper back Back Pain.  Symptoms began several months ago but has worsened.  Interventions attempted: Prescription medications: meloxicam  and Flexeril .  Symptoms are: gradually worsening.  Triage Disposition: See Physician Within 24 Hours  Patient/caregiver understands and will follow disposition?: Yes     Pt requesting refill of Meloxicam  and Flexeril . Routed to [facility] Rx refill pool.             Copied from CRM [id number]. Topic: Clinical - Red Word Triage >> Aug 14, 2023  2:12 PM Robinson H wrote: [facility] that prompted transfer to Nurse Triage: Pain has increased in back on spine Reason for Disposition  [1] Numbness in an arm or hand (i.e., loss of sensation) AND [2] upper back pain  Answer Assessment - Initial Assessment Questions 1. ONSET: When did the pain begin? (e.g., minutes, hours, days)    Chronic issue gotten worse  2. LOCATION: Where does it hurt? (upper, mid or lower back)     Upper back  3. SEVERITY: How bad is the pain?  (e.g., Scale 1-10; mild, moderate, or severe)     9/10 but can varies in morning 4. PATTERN: Is the pain constant? (e.g., yes, no; constant, intermittent)      constant 5. RADIATION: Does the pain shoot into your legs or somewhere else?     arm 6. CAUSE:  What do you think is causing the back pain?      Work worsening pain  7. BACK OVERUSE:  Any recent lifting of heavy objects, strenuous work or exercise?     Heaving lifting with job  8. MEDICINES: What have you taken so far for the pain? (e.g., nothing, acetaminophen , NSAIDS)     Flexeril  and Meloxicam  Celebrex 9. NEUROLOGIC SYMPTOMS: Do you have any weakness, numbness, or problems with bowel/bladder control?     Tingling  numbness down right arm to fingers   and in left   right is worse than left  10.  OTHER SYMPTOMS: Do you have any other symptoms? (e.g., fever, abdomen pain, burning with urination, blood in urine)       N/a  Protocols used: Back Pain-A-AH

## 2023-08-14 NOTE — Telephone Encounter (Signed)
 Sending as RICK. Patient has appt with Dr. Levora on Monday 08/17/23

## 2023-08-14 NOTE — Telephone Encounter (Signed)
 Pt requesting refill of Flexeril  and Meloxicam  To [facility] on file.

## 2023-08-17 ENCOUNTER — Encounter: Payer: Self-pay | Admitting: Family Medicine

## 2023-08-17 ENCOUNTER — Ambulatory Visit: Admitting: Family Medicine

## 2023-08-17 VITALS — BP 162/99 | HR 97 | Temp 98.2°F | Resp 18 | Ht 65.2 in | Wt 144.1 lb

## 2023-08-17 DIAGNOSIS — M5412 Radiculopathy, cervical region: Secondary | ICD-10-CM

## 2023-08-17 MED ORDER — PREDNISONE 20 MG PO TABS: 40.0000 mg | ORAL_TABLET | Freq: Every day | ORAL | 0 refills | Status: AC

## 2023-08-17 MED ORDER — TIZANIDINE HCL 4 MG PO TABS: 4.0000 mg | ORAL_TABLET | Freq: Four times a day (QID) | ORAL | 0 refills | Status: DC | PRN

## 2023-08-17 NOTE — Progress Notes (Signed)
 Subjective:     Patient ID: Chase Olson, male    DOB: 10/23/1980, 43 y.o.   MRN: [id number]  Chief Complaint  Patient presents with   Back Pain    Worsening upper back pain with numbness and tingling down both arms that started several months ago    HPI Discussed the use of AI scribe software for clinical note transcription with the patient, who gave verbal consent to proceed.  History of Present Illness Blandon Offerdahl is a 43 year old male who presents with worsening back pain and numbness in the arm and fingers.  He has been experiencing worsening back pain and numbness in his arm and fingers. The pain is located in the mid-upper back and radiates to the right deltoid area. He describes the pain as severe, stating that he couldn't get out of bed yesterday. The numbness is persistent, with fingertips remaining slightly numb even in the best position. Leaning forward provides some relief, but no position offers significant improvement. 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  The symptoms have been ongoing for several months, with a history of emergency room visits in February and June for similar complaints. During these visits, x-rays of the neck and shoulder were performed, revealing a slight spur in the shoulder. He was prescribed muscle relaxers, which provided temporary relief. However, the pain has become constant, worsening throughout the day despite medication use.  He has been prescribed meloxicam , cyclobenzaprine , and duloxetine , but reports no significant relief from these medications. He also takes Aleve  and ibuprofen , but has been advised not to mix them with meloxicam . Ice provides short-term relief, but he has not tried physical therapy.  A CT scan of the spine was performed, but an MRI has not been done due to insurance issues. He is aware of his prediabetes  status.    Health Maintenance Due  Topic Date Due   Hepatitis B Vaccines (1 of 3 - 19+ 3-dose series) Never done   HPV VACCINES (1 - 3-dose SCDM series) Never done   INFLUENZA VACCINE  08/07/2023    Past Medical History:  Diagnosis Date   Asthma    History of asthma    as a child   Shoulder dislocation 03/2013   left    Past Surgical History:  Procedure Laterality Date   ANKLE ARTHROSCOPY Right    HERNIA REPAIR Right 01/07/2007   INGUINAL HERNIA REPAIR Right    ROTATOR CUFF REPAIR Left    SHOULDER ARTHROSCOPY WITH LABRAL REPAIR Left 03/21/2013   Procedure: LEFT SHOULDER ARTHROSCOPY LABRAL REPAIR;  Surgeon: Eva Elsie Herring, MD;  Location: [facility];  Service: Orthopedics;  Laterality: Left;     Current Outpatient Medications:    DULoxetine  (CYMBALTA ) 30 MG capsule, Take 1 capsule (30 mg total) by mouth daily., Disp: 90 capsule, Rfl: 1   predniSONE  (DELTASONE ) 20 MG tablet, Take 2 tablets (40 mg total) by mouth daily with breakfast for 5 days., Disp: 10 tablet, Rfl: 0   tiZANidine  (ZANAFLEX ) 4 MG tablet, Take 1 tablet (4 mg total) by mouth every 6 (six) hours as needed for muscle spasms., Disp: 30 tablet, Rfl: 0  Allergies  Allergen Reactions   Morphine  Anaphylaxis   Tylenol  [Acetaminophen ] Itching   Vicodin [Hydrocodone -Acetaminophen ] Itching   ROS neg/noncontributory except as noted HPI/below      Objective:     BP ROLLEN)  162/99   Pulse 97   Temp 98.2 F (36.8 C) (Temporal)   Resp 18   Ht 5' 5.2 (1.656 m)   Wt 144 lb 2 oz (65.4 kg)   SpO2 96%   BMI 23.84 kg/m  Wt Readings from Last 3 Encounters:  08/17/23 144 lb 2 oz (65.4 kg)  07/16/23 147 lb (66.7 kg)  06/11/23 155 lb (70.3 kg)    Physical Exam   Gen: WDWN in pain HEENT: NCAT, conjunctiva not injected, sclera nonicteric NECK:  supple, no ttp spine but +tenderness and tightness paraspinous muscles down to between shoulder blades. EXT:  no edema MSK: no gross abnormalities. MS  5/5 bue NEURO: A&O x3.  CN II-XII intact.  PSYCH: normal mood. Good eye contact  Reviewed ER records, studies 06/11/15-CT spine:  Multilevel cervical spondylosis as above, most pronounced at the C6-7 level.At C6-7 there is moderate spondylosis with circumferential disc osteophyte complex resulting in symmetrical bilateral neural foraminal encroachment.      Assessment & Plan:  Cervical radiculopathy, chronic -     Ambulatory referral to Physical Therapy -     Ambulatory referral to Orthopedics  Other orders -     predniSONE ; Take 2 tablets (40 mg total) by mouth daily with breakfast for 5 days.  Dispense: 10 tablet; Refill: 0 -     tiZANidine  HCl; Take 1 tablet (4 mg total) by mouth every 6 (six) hours as needed for muscle spasms.  Dispense: 30 tablet; Refill: 0  Assessment and Plan Assessment & Plan Cervical radiculopathy with upper back and right shoulder pain   Chronic cervical radiculopathy presents with persistent upper back and right shoulder pain, worsening over several months. Symptoms include arm and finger numbness, with pain exacerbated by neck movement. Previous imaging revealed a slight shoulder spur. Medications such as meloxicam , cyclobenzaprine , and duloxetine  have been ineffective. Insurance denied MRI  Differential diagnosis includes possible lipoma, though muscle tenderness suggests a muscular origin, cervical disc, other neuropathy. Spinal injection may be necessary if conservative measures fail. Prednisone  may offer temporary relief but is not a long-term solution. Physical therapy is ordered . Referral to [facility] is planned for potential spinal injections and further evaluation, as he may expedite MRI scheduling. Prescribe prednisone  for inflammation and tizanidine  as a muscle relaxant. Advise acetaminophen  for pain management. Refer to physical therapy in [place] and follow up with Doctor Jerrell as needed.    Return if symptoms worsen or fail to improve.  Jenkins CHRISTELLA Carrel, MD

## 2023-08-17 NOTE — Patient Instructions (Signed)
 Sent prednisone  to pharm and tizanidine   Tylenol   Refer to PT and [facility]

## 2023-08-19 ENCOUNTER — Ambulatory Visit: Admitting: Family Medicine

## 2023-08-20 ENCOUNTER — Encounter: Payer: Self-pay | Admitting: Student in an Organized Health Care Education/Training Program

## 2023-08-24 ENCOUNTER — Ambulatory Visit: Attending: Family Medicine | Admitting: Physical Therapy

## 2023-08-24 ENCOUNTER — Other Ambulatory Visit: Payer: Self-pay

## 2023-08-24 ENCOUNTER — Encounter: Payer: Self-pay | Admitting: Physical Therapy

## 2023-08-24 DIAGNOSIS — M62838 Other muscle spasm: Secondary | ICD-10-CM | POA: Diagnosis present

## 2023-08-24 DIAGNOSIS — M5412 Radiculopathy, cervical region: Secondary | ICD-10-CM | POA: Diagnosis not present

## 2023-08-24 DIAGNOSIS — M542 Cervicalgia: Secondary | ICD-10-CM | POA: Diagnosis present

## 2023-08-24 MED ORDER — MELOXICAM 7.5 MG PO TABS
7.5000 mg | ORAL_TABLET | Freq: Every day | ORAL | 1 refills | Status: AC | PRN
Start: 2023-08-24 — End: ?
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
## 2023-09-02 ENCOUNTER — Ambulatory Visit

## 2023-09-02 DIAGNOSIS — M542 Cervicalgia: Secondary | ICD-10-CM

## 2023-09-02 DIAGNOSIS — M62838 Other muscle spasm: Secondary | ICD-10-CM
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
## 2023-09-09 ENCOUNTER — Ambulatory Visit: Payer: Self-pay | Attending: Family Medicine

## 2023-09-09 DIAGNOSIS — M542 Cervicalgia: Secondary | ICD-10-CM | POA: Insufficient documentation

## 2023-09-09 DIAGNOSIS — M62838 Other muscle spasm: Secondary | ICD-10-CM | POA: Diagnosis present
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
## 2023-10-22 ENCOUNTER — Ambulatory Visit: Payer: Self-pay | Admitting: *Deleted

## 2023-10-22 ENCOUNTER — Other Ambulatory Visit: Payer: Self-pay | Admitting: Student in an Organized Health Care Education/Training Program

## 2023-10-22 ENCOUNTER — Ambulatory Visit: Admitting: Student in an Organized Health Care Education/Training Program

## 2023-10-22 DIAGNOSIS — M5412 Radiculopathy, cervical region: Secondary | ICD-10-CM

## 2023-10-22 MED ORDER — DULOXETINE HCL 30 MG PO CPEP: 30.0000 mg | ORAL_CAPSULE | Freq: Every day | ORAL | 1 refills | Status: AC

## 2023-10-22 NOTE — Telephone Encounter (Signed)
 FYI

## 2023-10-22 NOTE — Telephone Encounter (Signed)
 FYI Only or Action Required?: FYI only for provider.  Patient was last seen in primary care on 08/17/2023 by Wendolyn Jenkins Jansky, MD.  Called Nurse Triage reporting Back Pain.  Symptoms began several days ago.  Interventions attempted: Prescription medications: zanaflex  .  Symptoms are: rapidly worsening.  Triage Disposition: See HCP Within 4 Hours (Or PCP Triage)  Patient/caregiver understands and will follow disposition?: Yes             Copied from CRM #[id number]. Topic: Clinical - Red Word Triage >> Oct 22, 2023  8:56 AM Robinson H wrote: [facility] that prompted transfer to Nurse Triage: Back pain gotten worse, upper spine above shoulder blades Reason for Disposition  [1] SEVERE back pain (e.g., excruciating, unable to do any normal activities) AND [2] not improved 2 hours after pain medicine  Answer Assessment - Initial Assessment Questions Appt scheduled today with PCP . Patient at work now. Recommended if pain or sx worsen go to UC/ED. Patient can use right arm but pain with movement and turning neck to right     1. ONSET: When did the pain begin? (e.g., minutes, hours, days)     3 days  2. LOCATION: Where does it hurt? (upper, mid or lower back)    Upper back , Right arm to fingertips, N/T and pain  3. SEVERITY: How bad is the pain?  (e.g., Scale 1-10; mild, moderate, or severe)     Severe  4. PATTERN: Is the pain constant? (e.g., yes, no; constant, intermittent)      Constant pain , N/T comes and goes with movement  5. RADIATION: Does the pain shoot into your legs or somewhere else?     Na 6. CAUSE:  What do you think is causing the back pain?      Has been without medication cymbalta  x 4-5 days 7. BACK OVERUSE:  Any recent lifting of heavy objects, strenuous work or exercise?     Drives fork lift for work 8. MEDICINES: What have you taken so far for the pain? (e.g., nothing, acetaminophen , NSAIDS)     Took last zanflex, gabapentin  9.  NEUROLOGIC SYMPTOMS: Do you have any weakness, numbness, or problems with bowel/bladder control?     N/T right arm to fingertips. 10. OTHER SYMPTOMS: Do you have any other symptoms? (e.g., fever, abdomen pain, burning with urination, blood in urine)       Turning head to right causes more pain, right arm to fingertips N/T and pain.  11. PREGNANCY: Is there any chance you are pregnant? When was your last menstrual period?       na  Protocols used: Back Pain-A-AH

## 2023-10-22 NOTE — Telephone Encounter (Signed)
 Requested Prescriptions   Pending Prescriptions Disp Refills   DULoxetine  (CYMBALTA ) 30 MG capsule 90 capsule 1    Sig: Take 1 capsule (30 mg total) by mouth daily.     Date of patient request: 10/22/2023 Last office visit: 07/16/2023 Upcoming visit: 10/22/2023 Date of last refill: 07/16/2023 Last refill amount: 90 1 refill

## 2023-10-22 NOTE — Telephone Encounter (Signed)
 Copied from CRM [id number]. Topic: Clinical - Medication Refill >> Oct 22, 2023  8:53 AM Robinson H wrote: Medication: DULoxetine  (CYMBALTA ) 30 MG capsule  Has the patient contacted their pharmacy? No, no refills on bottle (Agent: If no, request that the patient contact the pharmacy for the refill. If patient does not wish to contact the pharmacy document the reason why and proceed with request.) (Agent: If yes, when and what did the pharmacy advise?)  This is the patient's preferred pharmacy:   [id number] [place], [place] - [address] [phone] [address]  [place] [address]    Is this the correct pharmacy for this prescription? Yes If no, delete pharmacy and type the correct one.   Has the prescription been filled recently? No  Is the patient out of the medication? Yes  Has the patient been seen for an appointment in the last year OR does the patient have an upcoming appointment? Yes  Can we respond through MyChart? No  Agent: Please be advised that Rx refills may take up to 3 business days. We ask that you follow-up with your pharmacy.

## 2023-10-23 ENCOUNTER — Encounter: Payer: Self-pay | Admitting: Student in an Organized Health Care Education/Training Program

## 2023-10-23 ENCOUNTER — Ambulatory Visit (INDEPENDENT_AMBULATORY_CARE_PROVIDER_SITE_OTHER): Payer: Self-pay | Admitting: Student in an Organized Health Care Education/Training Program

## 2023-10-23 VITALS — BP 152/85 | HR 75 | Wt 152.0 lb

## 2023-10-23 DIAGNOSIS — M5412 Radiculopathy, cervical region: Secondary | ICD-10-CM

## 2023-10-23 DIAGNOSIS — I10 Essential (primary) hypertension: Secondary | ICD-10-CM

## 2023-10-23 MED ORDER — CYCLOBENZAPRINE HCL 5 MG PO TABS: 5.0000 mg | ORAL_TABLET | Freq: Every evening | ORAL | 1 refills | Status: DC | PRN

## 2023-10-23 NOTE — Patient Instructions (Signed)
  VISIT SUMMARY: Today, we discussed your worsening upper back pain and its impact on your daily life. We also reviewed your chronic hypertension and made adjustments to your treatment plan to better manage both conditions.  YOUR PLAN: -CHRONIC CERVICAL RADICULOPATHY: Chronic cervical radiculopathy is a condition where nerves in your neck are compressed or irritated, causing pain and tingling that can radiate to your shoulders and arms. We will continue your current medications, duloxetine  and ibuprofen , and have added Flexeril  (cyclobenzaprine ) for short-term use, especially at night. Please avoid mixing this muscle relaxer with alcohol. You should continue your physical therapy exercises as your symptoms improve. If conservative measures do not help, we may consider steroid injections or surgery. You are referred to the spine clinic for further evaluation and potential MRI approval.  -CHRONIC HYPERTENSION: Chronic hypertension is high blood pressure that can increase your risk of heart disease and stroke. Your blood pressure today was 152/85 mmHg. We have started you on an antihypertensive medication, which you should take one pill daily. It is important to take your medication as prescribed and make lifestyle changes, such as quitting smoking, to help manage your blood pressure. We will follow up in one month to recheck your blood pressure and assess your back pain management.  INSTRUCTIONS: Please follow up in one month to recheck your blood pressure and assess your back pain management. Continue taking your medications as prescribed and perform your physical therapy exercises. Avoid mixing Flexeril  with alcohol. You are referred to the spine clinic for further evaluation and potential MRI approval.

## 2023-10-23 NOTE — Progress Notes (Signed)
 Established Patient Office Visit  Subjective   Patient ID: Chase Olson, male    DOB: [birth year]  Age: 43 y.o. MRN: [id number]  Chief Complaint  Patient presents with   Back Pain    Back pain between shoulder blades but radiates to right shoulder and down arm. Hard to lift head or to turn head to the right.     HPI  Discussed the use of AI scribe software for clinical note transcription with the patient, who gave verbal consent to proceed.  History of Present Illness Chase Olson is a 43 year old male who presents with worsening upper back pain.  He describes a significant worsening of his upper back pain over the past few days, characterizing it as the worst it has ever been. The pain began suddenly upon waking and has progressively worsened throughout the day, making it difficult to find a comfortable position to sit or sleep. The pain starts in the upper back and radiates to the shoulder area, with a shooting pain when turning his head.  He has a history of back pain for which an MRI was previously ordered but not approved by his insurance. Instead, he was required to attend physical therapy sessions, of which he has completed three. The pain had somewhat improved over the summer but has recently flared up significantly.  He experiences tingling in his right arm and fingertips, particularly when looking in certain directions, but currently, the predominant symptom is pain rather than tingling. No fevers, chills, weakness, or numbness.  His current medications include duloxetine , which he recently ran out of but has since refilled, and ibuprofen . He also uses gabapentin, although it does not alleviate the tingling sensation.  The pain has impacted his ability to work, causing him to leave early on several occasions, although he has not taken any full days off. He works as a Electrical engineer.    Objective:     BP (!) 152/85   Pulse 75   Wt 152 lb (68.9 kg)   SpO2 97%    BMI 25.14 kg/m   Physical Exam  Gen: Well-appearing man Neuro: Alert, conversational, full strength of the upper and lower extremities MSK: Tenderness over the cervical spine, limited range of motion with turning his head to the right, normal with turning to the left Heart: Regular, no murmur Lungs: Unlabored, clear throughout Ext: Warm, well-perfused, no edema    Assessment & Plan:    Problem List Items Addressed This Visit       High   Cervical radiculopathy, chronic - Primary (Chronic)   He experiences chronic cervical radiculopathy with a recent severe flare-up, causing debilitating pain and tingling. Previous MRI was not approved, and physical therapy provided limited relief. If conservative measures fail, steroid injections or surgery may be considered. He is referred to the spine clinic for consultation. Continue duloxetine  and ibuprofen  for pain management. Prescribed Flexeril  (cyclobenzaprine ) for short-term use, especially at night, and advise against mixing muscle relaxers with alcohol. Recommended continuation of physical therapy exercises as symptoms improve. Discussed potential for steroid injections or surgical interventions if conservative measures fail.      Relevant Medications   gabapentin (NEURONTIN) 300 MG capsule   cyclobenzaprine  (FLEXERIL ) 5 MG tablet   Other Relevant Orders   Ambulatory referral to Spine Surgery     Medium    Hypertension (Chronic)   He has chronic hypertension with a blood pressure of 152/85 mmHg. Discussed long-term risks and benefits of antihypertensive medication to reduce  future cardiovascular events. Initiate antihypertensive medication, Hyzaar one pill daily. Emphasized the importance of medication adherence and lifestyle modifications, including smoking cessation. Schedule follow-up in one month to recheck blood pressure and assess back pain management.      Relevant Medications   losartan-hydrochlorothiazide (HYZAAR) 50-12.5 MG  tablet    Return in about 4 weeks (around 11/20/2023) for HTN management.    Chase Debby Specking, MD

## 2023-10-23 NOTE — Assessment & Plan Note (Signed)
 He has chronic hypertension with a blood pressure of 152/85 mmHg. Discussed long-term risks and benefits of antihypertensive medication to reduce future cardiovascular events. Initiate antihypertensive medication, Hyzaar one pill daily. Emphasized the importance of medication adherence and lifestyle modifications, including smoking cessation. Schedule follow-up in one month to recheck blood pressure and assess back pain management.

## 2023-10-23 NOTE — Assessment & Plan Note (Signed)
 He experiences chronic cervical radiculopathy with a recent severe flare-up, causing debilitating pain and tingling. Previous MRI was not approved, and physical therapy provided limited relief. If conservative measures fail, steroid injections or surgery may be considered. He is referred to the spine clinic for consultation. Continue duloxetine  and ibuprofen  for pain management. Prescribed Flexeril  (cyclobenzaprine ) for short-term use, especially at night, and advise against mixing muscle relaxers with alcohol. Recommended continuation of physical therapy exercises as symptoms improve. Discussed potential for steroid injections or surgical interventions if conservative measures fail.

## 2023-11-26 ENCOUNTER — Ambulatory Visit: Payer: Self-pay

## 2023-11-26 ENCOUNTER — Ambulatory Visit: Admitting: Student in an Organized Health Care Education/Training Program

## 2023-11-26 ENCOUNTER — Encounter: Payer: Self-pay | Admitting: Student in an Organized Health Care Education/Training Program

## 2023-11-26 NOTE — Telephone Encounter (Signed)
 Patient was not on call upon warm transfer, attempted to reach out to patient, received recording that voicemail box has not been set up and unable to leave message. Placing chart in call back folder for further attempts to reach patient.   Copied from CRM [id number]. Topic: Clinical - Red Word Triage >> Nov 26, 2023  3:31 PM Chase Olson ORN wrote: Red Word that prompted transfer to Nurse Triage: back pain starting to spread to left shoulder , finger tips   Was rescheduling appt that was cancelled

## 2023-11-26 NOTE — Telephone Encounter (Signed)
 FYI Only or Action Required?: FYI only for provider: appointment scheduled on 12/08/23.  Patient was last seen in primary care on 10/23/2023 by Jerrell Cleatus Ned, MD.  Called Nurse Triage reporting Back Pain.  Symptoms began several months ago.  Interventions attempted: Rest, hydration, or home remedies.  Symptoms are: unchanged.  Triage Disposition: See PCP Within 2 Weeks  Patient/caregiver understands and will follow disposition?: Yes .Reason for Disposition  Back pain present > 2 weeks  Answer Assessment - Initial Assessment Questions Patient was to get a CT scan but was told to do PT first. Santina to ED after visit in October and found out he fractured his shoulder and got a CT scan last Thursday. Fracture occurred due to tendon tearing from bone.   1. ONSET: When did the pain begin? (e.g., minutes, hours, days)     2-3 months ago  2. LOCATION: Where does it hurt? (upper, mid or lower back)     Neck, mid back, goes to the left  3. SEVERITY: How bad is the pain?  (e.g., Scale 1-10; mild, moderate, or severe)     Worsening  4. PATTERN: Is the pain constant? (e.g., yes, no; constant, intermittent)      Constant  5. MEDICINES: What have you taken so far for the pain? (e.g., nothing, acetaminophen , NSAIDS)     None, states muscle relaxers did help a bit  9. NEUROLOGIC SYMPTOMS: Do you have any weakness, numbness, or problems with bowel/bladder control?     Left arm finger tips get numb and tingling   10. OTHER SYMPTOMS: Do you have any other symptoms? (e.g., fever, abdomen pain, burning with urination, blood in urine)       Neck pain is really uncomfortable, hard to find good position to sleep in.  Protocols used: Back Pain-A-AH

## 2023-11-26 NOTE — Telephone Encounter (Signed)
 Called patient to see if he needed a sooner appointment. Patient states this information was more just FYI. Patient had CT scan done of right shoulder and is wondering if this shows anything with his back as well? I did inform patient this scan may not have shown anything with his back just his right shoulder but I would send this questions to Dr.Vincent to get a more accurate answer.  Patient's CT scan results are in care everywhere through [facility]   I did offer patient a sooner appointment but patient stated he has an appointment on 12/02 and states he is okay with his appointment being out this far.

## 2023-11-27 NOTE — Telephone Encounter (Signed)
 CT of the shoulder only takes pictures of the shoulder joint.  It does not give us  information about his back.  Will talk with him more at our visit on 12/2.

## 2023-12-08 ENCOUNTER — Telehealth: Payer: Self-pay | Admitting: Student in an Organized Health Care Education/Training Program

## 2023-12-08 ENCOUNTER — Ambulatory Visit: Payer: Self-pay | Admitting: Student in an Organized Health Care Education/Training Program

## 2023-12-08 DIAGNOSIS — M5412 Radiculopathy, cervical region: Secondary | ICD-10-CM

## 2023-12-08 NOTE — Telephone Encounter (Unsigned)
 Copied from CRM #[id number]. Topic: Clinical - Medication Refill >> Dec 08, 2023  1:48 PM Drema MATSU wrote: Medication: cyclobenzaprine  (FLEXERIL ) 5 MG tablet  Has the patient contacted their pharmacy? No (Agent: If no, request that the patient contact the pharmacy for the refill. If patient does not wish to contact the pharmacy document the reason why and proceed with request.) no more refills  (Agent: If yes, when and what did the pharmacy advise?)  This is the patient's preferred pharmacy:  [id number] - [place], [place] - [address] [place] [address] Phone: [phone] Fax: [phone]  Is this the correct pharmacy for this prescription? Yes If no, delete pharmacy and type the correct one.   Has the prescription been filled recently? Yes  Is the patient out of the medication? Yes  Has the patient been seen for an appointment in the last year OR does the patient have an upcoming appointment? Yes  Can we respond through MyChart? No  Agent: Please be advised that Rx refills may take up to 3 business days. We ask that you follow-up with your pharmacy.

## 2023-12-09 NOTE — Telephone Encounter (Signed)
 Requested Prescriptions   Pending Prescriptions Disp Refills   cyclobenzaprine  (FLEXERIL ) 5 MG tablet 30 tablet 1    Sig: Take 1 tablet (5 mg total) by mouth at bedtime as needed for muscle spasms.     Date of patient request: 12/09/2023 Last office visit: 10/23/2023 Upcoming visit: Visit date not found Date of last refill: 10/23/2023 Last refill amount: 30

## 2023-12-10 MED ORDER — CYCLOBENZAPRINE HCL 5 MG PO TABS: 5.0000 mg | ORAL_TABLET | Freq: Every evening | ORAL | 1 refills | Status: AC | PRN
